# Patient Record
Sex: Female | Born: 1950
Health system: Southern US, Community
[De-identification: ages and names within clinical notes are randomized; demographics above are authoritative.]

## PROBLEM LIST (undated history)

## (undated) DIAGNOSIS — Z923 Personal history of irradiation: Secondary | ICD-10-CM

## (undated) DIAGNOSIS — Z9221 Personal history of antineoplastic chemotherapy: Secondary | ICD-10-CM

## (undated) DIAGNOSIS — E041 Nontoxic single thyroid nodule: Secondary | ICD-10-CM

## (undated) DIAGNOSIS — C50919 Malignant neoplasm of unspecified site of unspecified female breast: Secondary | ICD-10-CM

## (undated) HISTORY — DX: Nontoxic single thyroid nodule: E04.1

## (undated) HISTORY — PX: BREAST BIOPSY: SHX20

## (undated) HISTORY — DX: Malignant neoplasm of unspecified site of unspecified female breast: C50.919

---
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
## 1974-10-27 HISTORY — PX: TUBAL LIGATION: SHX77

## 1976-10-27 HISTORY — PX: BARTHOLIN GLAND CYST EXCISION: SHX565

## 2006-10-27 DIAGNOSIS — Z923 Personal history of irradiation: Secondary | ICD-10-CM

## 2006-10-27 DIAGNOSIS — C50919 Malignant neoplasm of unspecified site of unspecified female breast: Secondary | ICD-10-CM

## 2006-10-27 HISTORY — DX: Malignant neoplasm of unspecified site of unspecified female breast: C50.919

## 2006-10-27 HISTORY — DX: Personal history of irradiation: Z92.3

## 2007-03-19 HISTORY — PX: BREAST BIOPSY: SHX20

## 2007-03-28 DIAGNOSIS — Z9221 Personal history of antineoplastic chemotherapy: Secondary | ICD-10-CM

## 2007-03-28 HISTORY — DX: Personal history of antineoplastic chemotherapy: Z92.21

## 2007-08-11 HISTORY — PX: BREAST LUMPECTOMY: SHX2

## 2013-08-10 HISTORY — PX: BREAST BIOPSY: SHX20

## 2013-08-25 LAB — HM MAMMOGRAPHY

## 2015-10-28 DIAGNOSIS — E041 Nontoxic single thyroid nodule: Secondary | ICD-10-CM

## 2015-10-28 HISTORY — DX: Nontoxic single thyroid nodule: E04.1

## 2015-10-28 HISTORY — PX: BREAST EXCISIONAL BIOPSY: SUR124

## 2016-04-12 LAB — VITAMIN D 25 HYDROXY (VIT D DEFICIENCY, FRACTURES): Vit D, 25-Hydroxy: 26.3

## 2016-04-12 LAB — HEPATIC FUNCTION PANEL
ALT: 20 (ref 7–35)
AST: 19 (ref 13–35)
Alkaline Phosphatase: 60 (ref 25–125)
BILIRUBIN, TOTAL: 0.4

## 2016-04-12 LAB — LIPID PANEL
Cholesterol: 261 — AB (ref 0–200)
HDL: 62 (ref 35–70)
LDL Cholesterol: 179
Triglycerides: 98 (ref 40–160)

## 2016-04-12 LAB — BASIC METABOLIC PANEL
BUN: 15 (ref 4–21)
Creatinine: 0.7 (ref 0.5–1.1)
GLUCOSE: 96
POTASSIUM: 5.1 (ref 3.4–5.3)
SODIUM: 144 (ref 137–147)

## 2016-04-12 LAB — CBC AND DIFFERENTIAL
HEMATOCRIT: 46 (ref 36–46)
HEMOGLOBIN: 14.9 (ref 12.0–16.0)
Neutrophils Absolute: 3
PLATELETS: 154 (ref 150–399)
WBC: 5.4

## 2016-07-03 LAB — HM COLONOSCOPY

## 2017-07-23 ENCOUNTER — Ambulatory Visit (INDEPENDENT_AMBULATORY_CARE_PROVIDER_SITE_OTHER): Payer: Medicare Other | Admitting: Family Medicine

## 2017-07-23 ENCOUNTER — Encounter: Payer: Self-pay | Admitting: Family Medicine

## 2017-07-23 VITALS — BP 112/66 | HR 72 | Temp 98.1°F | Resp 16 | Ht 60.0 in | Wt 163.0 lb

## 2017-07-23 DIAGNOSIS — E7800 Pure hypercholesterolemia, unspecified: Secondary | ICD-10-CM

## 2017-07-23 DIAGNOSIS — E785 Hyperlipidemia, unspecified: Secondary | ICD-10-CM | POA: Insufficient documentation

## 2017-07-23 DIAGNOSIS — Z0000 Encounter for general adult medical examination without abnormal findings: Secondary | ICD-10-CM

## 2017-07-23 DIAGNOSIS — E669 Obesity, unspecified: Secondary | ICD-10-CM

## 2017-07-23 DIAGNOSIS — Z853 Personal history of malignant neoplasm of breast: Secondary | ICD-10-CM | POA: Diagnosis not present

## 2017-07-23 DIAGNOSIS — Z780 Asymptomatic menopausal state: Secondary | ICD-10-CM

## 2017-07-23 DIAGNOSIS — Z23 Encounter for immunization: Secondary | ICD-10-CM

## 2017-07-23 DIAGNOSIS — Z1231 Encounter for screening mammogram for malignant neoplasm of breast: Secondary | ICD-10-CM

## 2017-07-23 DIAGNOSIS — E041 Nontoxic single thyroid nodule: Secondary | ICD-10-CM | POA: Insufficient documentation

## 2017-07-23 DIAGNOSIS — Z6831 Body mass index (BMI) 31.0-31.9, adult: Secondary | ICD-10-CM

## 2017-07-23 LAB — CBC WITH DIFFERENTIAL/PLATELET
Basophils Absolute: 42 cells/uL (ref 0–200)
Basophils Relative: 0.7 %
EOS PCT: 1.5 %
Eosinophils Absolute: 90 cells/uL (ref 15–500)
HEMATOCRIT: 42.3 % (ref 35.0–45.0)
HEMOGLOBIN: 14.2 g/dL (ref 11.7–15.5)
LYMPHS ABS: 2286 {cells}/uL (ref 850–3900)
MCH: 29.8 pg (ref 27.0–33.0)
MCHC: 33.6 g/dL (ref 32.0–36.0)
MCV: 88.7 fL (ref 80.0–100.0)
MPV: 12.8 fL — AB (ref 7.5–12.5)
Monocytes Relative: 8.6 %
NEUTROS ABS: 3066 {cells}/uL (ref 1500–7800)
Neutrophils Relative %: 51.1 %
Platelets: 146 10*3/uL (ref 140–400)
RBC: 4.77 10*6/uL (ref 3.80–5.10)
RDW: 12.7 % (ref 11.0–15.0)
Total Lymphocyte: 38.1 %
WBC mixed population: 516 cells/uL (ref 200–950)
WBC: 6 10*3/uL (ref 3.8–10.8)

## 2017-07-23 LAB — LIPID PANEL
Cholesterol: 287 mg/dL — ABNORMAL HIGH (ref <200)
HDL: 61 mg/dL (ref >50)
LDL CHOLESTEROL (CALC): 192 mg/dL — AB
NON-HDL CHOLESTEROL (CALC): 226 mg/dL — AB (ref <130)
Total CHOL/HDL Ratio: 4.7 (calc) (ref <5.0)
Triglycerides: 168 mg/dL — ABNORMAL HIGH (ref <150)

## 2017-07-23 LAB — COMPREHENSIVE METABOLIC PANEL
AG RATIO: 1.6 (calc) (ref 1.0–2.5)
ALT: 25 U/L (ref 6–29)
AST: 24 U/L (ref 10–35)
Albumin: 4.2 g/dL (ref 3.6–5.1)
Alkaline phosphatase (APISO): 62 U/L (ref 33–130)
BILIRUBIN TOTAL: 0.3 mg/dL (ref 0.2–1.2)
BUN: 20 mg/dL (ref 7–25)
CALCIUM: 9.2 mg/dL (ref 8.6–10.4)
CO2: 26 mmol/L (ref 20–32)
Chloride: 103 mmol/L (ref 98–110)
Creat: 0.69 mg/dL (ref 0.50–0.99)
GLUCOSE: 103 mg/dL — AB (ref 65–99)
Globulin: 2.7 g/dL (calc) (ref 1.9–3.7)
POTASSIUM: 3.9 mmol/L (ref 3.5–5.3)
Sodium: 139 mmol/L (ref 135–146)
TOTAL PROTEIN: 6.9 g/dL (ref 6.1–8.1)

## 2017-07-23 LAB — TSH: TSH: 0.6 mIU/L (ref 0.40–4.50)

## 2017-07-23 NOTE — Patient Instructions (Signed)
Preventive Care 65 Years and Older, Female Preventive care refers to lifestyle choices and visits with your health care provider that can promote health and wellness. What does preventive care include?  A yearly physical exam. This is also called an annual well check.  Dental exams once or twice a year.  Routine eye exams. Ask your health care provider how often you should have your eyes checked.  Personal lifestyle choices, including: ? Daily care of your teeth and gums. ? Regular physical activity. ? Eating a healthy diet. ? Avoiding tobacco and drug use. ? Limiting alcohol use. ? Practicing safe sex. ? Taking low-dose aspirin every day. ? Taking vitamin and mineral supplements as recommended by your health care provider. What happens during an annual well check? The services and screenings done by your health care provider during your annual well check will depend on your age, overall health, lifestyle risk factors, and family history of disease. Counseling Your health care provider may ask you questions about your:  Alcohol use.  Tobacco use.  Drug use.  Emotional well-being.  Home and relationship well-being.  Sexual activity.  Eating habits.  History of falls.  Memory and ability to understand (cognition).  Work and work environment.  Reproductive health.  Screening You may have the following tests or measurements:  Height, weight, and BMI.  Blood pressure.  Lipid and cholesterol levels. These may be checked every 5 years, or more frequently if you are over 50 years old.  Skin check.  Lung cancer screening. You may have this screening every year starting at age 55 if you have a 30-pack-year history of smoking and currently smoke or have quit within the past 15 years.  Fecal occult blood test (FOBT) of the stool. You may have this test every year starting at age 50.  Flexible sigmoidoscopy or colonoscopy. You may have a sigmoidoscopy every 5 years or  a colonoscopy every 10 years starting at age 50.  Hepatitis C blood test.  Hepatitis B blood test.  Sexually transmitted disease (STD) testing.  Diabetes screening. This is done by checking your blood sugar (glucose) after you have not eaten for a while (fasting). You may have this done every 1-3 years.  Bone density scan. This is done to screen for osteoporosis. You may have this done starting at age 65.  Mammogram. This may be done every 1-2 years. Talk to your health care provider about how often you should have regular mammograms.  Talk with your health care provider about your test results, treatment options, and if necessary, the need for more tests. Vaccines Your health care provider may recommend certain vaccines, such as:  Influenza vaccine. This is recommended every year.  Tetanus, diphtheria, and acellular pertussis (Tdap, Td) vaccine. You may need a Td booster every 10 years.  Varicella vaccine. You may need this if you have not been vaccinated.  Zoster vaccine. You may need this after age 60.  Measles, mumps, and rubella (MMR) vaccine. You may need at least one dose of MMR if you were born in 1957 or later. You may also need a second dose.  Pneumococcal 13-valent conjugate (PCV13) vaccine. One dose is recommended after age 65.  Pneumococcal polysaccharide (PPSV23) vaccine. One dose is recommended after age 65.  Meningococcal vaccine. You may need this if you have certain conditions.  Hepatitis A vaccine. You may need this if you have certain conditions or if you travel or work in places where you may be exposed to hepatitis   A.  Hepatitis B vaccine. You may need this if you have certain conditions or if you travel or work in places where you may be exposed to hepatitis B.  Haemophilus influenzae type b (Hib) vaccine. You may need this if you have certain conditions.  Talk to your health care provider about which screenings and vaccines you need and how often you  need them. This information is not intended to replace advice given to you by your health care provider. Make sure you discuss any questions you have with your health care provider. Document Released: 11/09/2015 Document Revised: 07/02/2016 Document Reviewed: 08/14/2015 Elsevier Interactive Patient Education  2017 [employer].

## 2017-07-23 NOTE — Progress Notes (Signed)
Patient: April Stuart, Female    DOB: 23-Jul-1951, 66 y.o.   MRN: [id number] Visit Date: 07/24/2017  Today's Provider: Lavon Paganini, MD   Chief Complaint  Patient presents with  . Establish Care  . Medicare Wellness   Subjective:    Annual wellness visit April Stuart is a 66 y.o. female. She feels well. She reports exercising daily; walks for 30-45 minutes with a neighbor. She reports she is sleeping fairly well. Pt reports she has had thyroid problems in the past. She states she believes she had "polyps" on her thyroid, and needs [place] every 6 months. She has a H/O breast cancer, and had a lumpectomy in 2008.  Now just has annual mammograms.  Last year had L axillary node biopsied and it was benign.  She has had high cholesterol that she is controlling with diet and exercise.  She does not take a statin.  She has not tolerated Lipitor or Crestor due to myalgias and is hesitant to try another statin. -----------------------------------------------------------   Review of Systems  Constitutional: Negative.   HENT: Positive for nosebleeds (improving). Negative for congestion, dental problem, drooling, ear discharge, ear pain, facial swelling, hearing loss, mouth sores, postnasal drip, rhinorrhea, sinus pain, sinus pressure, sneezing, sore throat, tinnitus, trouble swallowing and voice change.   Eyes: Negative.   Respiratory: Negative.   Cardiovascular: Negative.   Gastrointestinal: Negative.   Endocrine: Negative.   Genitourinary: Negative.   Musculoskeletal: Negative.   Skin: Negative.   Allergic/Immunologic: Negative.   Neurological: Negative.   Hematological: Negative.   Psychiatric/Behavioral: Negative.     Social History   Social History  . Marital status: Divorced    Spouse name: N/A  . Number of children: 3  . Years of education: N/A   Occupational History  . retired     Armed forces technical officer with [employer]   Social History Main Topics  .  Smoking status: Former Smoker    Packs/day: 2.00    Years: 38.00    Types: Cigarettes    Quit date: 10/26/1997  . Smokeless tobacco: Never Used  . Alcohol use 3.0 - 4.2 oz/week    5 - 7 Glasses of wine per week  . Drug use: No  . Sexual activity: No   Other Topics Concern  . Not on file   Social History Narrative  . No narrative on file    Past Medical History:  Diagnosis Date  . Breast cancer ([place]) 2008   R breast  . Thyroid nodule 2017     Patient Active Problem List   Diagnosis Date Noted  . Obesity 07/24/2017  . History of breast cancer 07/23/2017  . Hyperlipidemia 07/23/2017  . Thyroid nodule 07/23/2017    Past Surgical History:  Procedure Laterality Date  . APPENDECTOMY  1958  . BARTHOLIN GLAND CYST EXCISION  1978  . BREAST LUMPECTOMY Right 2008  . [place]   x [place]  . TUBAL LIGATION  1976    Her family history includes Alcohol abuse in her mother; Dementia in her mother; HIV in her brother; Pancreatic cancer in her father; Prostate cancer in her father; Stroke (age of onset: 58) in her mother.      Current Outpatient Prescriptions:  Marland Kitchen  Multiple Vitamins-Minerals (CENTRUM ADULTS PO), Take by mouth., Disp: , Rfl:   Patient Care Team: Virginia Crews, MD as PCP - General (Family Medicine)  Objective:   Vitals: BP 112/66 (BP Location: Left Arm, Patient Position: Sitting, Cuff Size: Normal)   Pulse 72   Temp 98.1 F (36.7 C) (Oral)   Resp 16   Ht 5' (1.524 m)   Wt 163 lb (73.9 kg)   BMI 31.83 kg/m   Physical Exam  Constitutional: She is oriented to person, place, and time. She appears well-developed and well-nourished. No distress.  HENT:  Head: Normocephalic and atraumatic.  Right Ear: External ear normal.  Left Ear: External ear normal.  Nose: Nose normal.  Mouth/Throat: Oropharynx is clear and moist.  Eyes: Pupils are equal, round, and reactive to light. Conjunctivae are normal. No  scleral icterus.  Neck: Neck supple. No thyromegaly present.  Cardiovascular: Normal rate, regular rhythm, normal heart sounds and intact distal pulses.   No murmur heard. Pulmonary/Chest: Effort normal and breath sounds normal. No respiratory distress. She has no wheezes. She has no rales.  Breasts: right breast normal without skin or nipple changes or axillary nodes, right breast with 2 lumpectomy scars with surrounding scar tissue palpable. left breast normal without mass, skin or nipple changes or axillary nodes.  Abdominal: Soft. Bowel sounds are normal. She exhibits no distension. There is no tenderness. There is no rebound and no guarding.  Musculoskeletal: She exhibits no edema or deformity.  Lymphadenopathy:    She has no cervical adenopathy.  Neurological: She is alert and oriented to person, place, and time.  Skin: Skin is warm and dry. No rash noted.  Psychiatric: She has a normal mood and affect. Her behavior is normal.  Vitals reviewed.   Activities of Daily Living In your present state of health, do you have any difficulty performing the following activities: 07/23/2017  Hearing? N  Vision? N  Difficulty concentrating or making decisions? N  Walking or climbing stairs? N  Dressing or bathing? N  Doing errands, shopping? N    Fall Risk Assessment Fall Risk  07/23/2017  Falls in the past year? No     Depression Screen PHQ 2/9 Scores 07/23/2017  PHQ - 2 Score 0    Cognitive Testing - 6-CIT  Correct? Score   What year is it? yes 0 0 or 4  What month is it? yes 0 0 or 3  Memorize:    Pia Mau,  41,  [address]  [place],      What time is it? (within 1 hour) yes 0 0 or 3  Count backwards from 20 yes 2 0, 2, or 4  Name the months of the year yes 0 0, 2, or 4  Repeat name & address above no 4 0, 2, 4, 6, 8, or 10       TOTAL SCORE  6/28   Interpretation:  Normal  Normal (0-7) Abnormal (8-28)     Assessment & Plan:     Annual Wellness Visit  Reviewed  patient's Family Medical History Reviewed and updated list of patient's medical providers Assessment of cognitive impairment was done Assessed patient's functional ability Established a written schedule for health screening [place] Completed and Reviewed  Exercise Activities and Dietary recommendations Goals    None      Immunization History  Administered Date(s) Administered  . Influenza, High Dose Seasonal PF 07/23/2017    Health Maintenance  Topic Date Due  . Hepatitis C Screening  Mar 04, 1951  . TETANUS/TDAP  03/07/1969  . MAMMOGRAM  03/07/2000  . COLONOSCOPY  03/07/2000  . DEXA SCAN  03/08/2015  . PNA vac Low Risk Adult (1 of 2 - PCV13) 03/08/2015  . INFLUENZA VACCINE  Completed     Discussed health benefits of physical activity, and encouraged her to engage in regular exercise appropriate for her age and condition.   Problem List Items Addressed This Visit      Endocrine   Thyroid nodule    Check TSH and repeat Thyroid US Will request records from previous PCP regarding previous scans      Relevant Orders   TSH (Completed)   US THYROID     Other   History of breast cancer    Continue screening mammograms Latest mammogram scanned into chart for comparison      Relevant Orders   MM Digital Screening   Hyperlipidemia    Repeat lipid panel Consider PCSK9 if high ASCVD risk      Relevant Orders   Comprehensive metabolic panel (Completed)   Lipid panel (Completed)   Obesity    Discussed healthy diet and exercise       Other Visit Diagnoses    Medicare annual wellness visit, subsequent    -  Primary   Healthcare maintenance       Relevant Orders   CBC w/Diff/Platelet (Completed)   Comprehensive metabolic panel (Completed)   Lipid panel (Completed)   TSH (Completed)   MM Digital Screening   Encounter for screening mammogram for breast cancer       Relevant Orders   MM Digital Screening   Postmenopausal estrogen  deficiency       Relevant Orders   DG Bone Density   Encounter for immunization       Relevant Orders   Flu vaccine HIGH DOSE PF (Completed)        ------------------------------------------------------------------------------------------------------------ The entirety of the information documented in the History of Present Illness, Review of Systems and Physical Exam were personally obtained by me. Portions of this information were initially documented by Raquel Sarna Ratchford, CMA and reviewed by me for thoroughness and accuracy.     Lavon Paganini, MD  [facility]

## 2017-07-24 ENCOUNTER — Telehealth: Payer: Self-pay

## 2017-07-24 ENCOUNTER — Other Ambulatory Visit: Payer: Self-pay | Admitting: *Deleted

## 2017-07-24 ENCOUNTER — Inpatient Hospital Stay
Admission: RE | Admit: 2017-07-24 | Discharge: 2017-07-24 | Disposition: A | Discharge status: Home / Self Care | Payer: Self-pay | Source: Ambulatory Visit | Attending: *Deleted | Admitting: *Deleted

## 2017-07-24 DIAGNOSIS — Z9289 Personal history of other medical treatment: Secondary | ICD-10-CM

## 2017-07-24 DIAGNOSIS — E669 Obesity, unspecified: Secondary | ICD-10-CM | POA: Insufficient documentation

## 2017-07-24 NOTE — Assessment & Plan Note (Signed)
Continue screening mammograms Latest mammogram scanned into chart for comparison

## 2017-07-24 NOTE — Assessment & Plan Note (Signed)
Check TSH and repeat Thyroid US Will request records from previous PCP regarding previous scans

## 2017-07-24 NOTE — Assessment & Plan Note (Signed)
Discussed healthy diet and exercise

## 2017-07-24 NOTE — Telephone Encounter (Signed)
Patient has been advised. KW 

## 2017-07-24 NOTE — Telephone Encounter (Signed)
-----   Message from Virginia Crews, MD sent at 07/24/2017 10:09 AM EDT ----- Normal Blood counts, kidney function, liver function, electrolytes, Thyroid function.  Glucose is slightly elevated if she was fasting at the time (doubtful given it was late afternoon).  Cholesterol is high.  10 year risk of heart disease/stroke is still low at 6%. Over 7.5%, I would suggest cholesterol-lowering medication.  I would start a daily baby aspirin for heart attack and stroke prevention.  Virginia Crews, MD, MPH [facility] 07/24/2017 10:09 AM

## 2017-07-24 NOTE — Telephone Encounter (Signed)
Unsure where to fax pt's release of information form. Had primary in [place], [place]. Pt states she will call back with information.

## 2017-07-24 NOTE — Telephone Encounter (Signed)
Pt returned call and provided information. ROI faxed.

## 2017-07-24 NOTE — Assessment & Plan Note (Signed)
Repeat lipid panel Consider PCSK9 if high ASCVD risk

## 2017-07-27 ENCOUNTER — Encounter: Payer: Self-pay | Admitting: Family Medicine

## 2017-07-29 ENCOUNTER — Ambulatory Visit
Admission: RE | Admit: 2017-07-29 | Discharge: 2017-07-29 | Disposition: A | Discharge status: Home / Self Care | Payer: Medicare Other | Source: Ambulatory Visit | Attending: Family Medicine | Admitting: Family Medicine

## 2017-07-29 DIAGNOSIS — E041 Nontoxic single thyroid nodule: Secondary | ICD-10-CM | POA: Insufficient documentation

## 2017-07-30 ENCOUNTER — Other Ambulatory Visit: Payer: Self-pay | Admitting: Family Medicine

## 2017-07-30 ENCOUNTER — Telehealth: Payer: Self-pay

## 2017-07-30 DIAGNOSIS — E041 Nontoxic single thyroid nodule: Secondary | ICD-10-CM

## 2017-07-30 NOTE — Telephone Encounter (Signed)
Pt advised and agrees with treatment plan. 

## 2017-07-30 NOTE — Telephone Encounter (Signed)
-----   Message from Virginia Crews, MD sent at 07/30/2017  8:26 AM EDT ----- 4 different thyroid nodules noted.  Likely needs biopsy to evaluate further.  Referral to ENT placed.  Virginia Crews, MD, MPH [facility] 07/30/2017 8:26 AM

## 2017-07-31 ENCOUNTER — Other Ambulatory Visit: Payer: Self-pay | Admitting: *Deleted

## 2017-07-31 ENCOUNTER — Inpatient Hospital Stay
Admission: RE | Admit: 2017-07-31 | Discharge: 2017-07-31 | Disposition: A | Discharge status: Home / Self Care | Payer: Self-pay | Source: Ambulatory Visit | Attending: *Deleted | Admitting: *Deleted

## 2017-07-31 DIAGNOSIS — Z9289 Personal history of other medical treatment: Secondary | ICD-10-CM

## 2017-08-12 ENCOUNTER — Telehealth: Payer: Self-pay | Admitting: Family Medicine

## 2017-08-12 NOTE — Telephone Encounter (Signed)
ROI ([facility]) faxed to [facility]  ( Dr. Silver Huguenin).

## 2017-08-18 DIAGNOSIS — E042 Nontoxic multinodular goiter: Secondary | ICD-10-CM | POA: Diagnosis not present

## 2017-08-19 ENCOUNTER — Other Ambulatory Visit: Payer: Self-pay | Admitting: Unknown Physician Specialty

## 2017-08-19 ENCOUNTER — Ambulatory Visit
Admission: RE | Admit: 2017-08-19 | Discharge: 2017-08-19 | Disposition: A | Discharge status: Home / Self Care | Payer: Medicare Other | Source: Ambulatory Visit | Attending: Family Medicine | Admitting: Family Medicine

## 2017-08-19 ENCOUNTER — Telehealth: Payer: Self-pay

## 2017-08-19 DIAGNOSIS — Z1231 Encounter for screening mammogram for malignant neoplasm of breast: Secondary | ICD-10-CM | POA: Diagnosis not present

## 2017-08-19 DIAGNOSIS — Z13820 Encounter for screening for osteoporosis: Secondary | ICD-10-CM | POA: Diagnosis not present

## 2017-08-19 DIAGNOSIS — Z853 Personal history of malignant neoplasm of breast: Secondary | ICD-10-CM

## 2017-08-19 DIAGNOSIS — Z0000 Encounter for general adult medical examination without abnormal findings: Secondary | ICD-10-CM

## 2017-08-19 DIAGNOSIS — E041 Nontoxic single thyroid nodule: Secondary | ICD-10-CM

## 2017-08-19 DIAGNOSIS — Z780 Asymptomatic menopausal state: Secondary | ICD-10-CM | POA: Diagnosis not present

## 2017-08-19 HISTORY — DX: Personal history of antineoplastic chemotherapy: Z92.21

## 2017-08-19 HISTORY — DX: Personal history of irradiation: Z92.3

## 2017-08-19 NOTE — Telephone Encounter (Signed)
Left message advising pt. OK per DPR. 

## 2017-08-19 NOTE — Telephone Encounter (Signed)
-----   Message from Virginia Crews, MD sent at 08/19/2017  3:37 PM EDT ----- Normal bone density.  No osteopenia or osteoporosis  Virginia Crews, MD, MPH [facility] 08/19/2017 3:37 PM

## 2017-08-20 ENCOUNTER — Telehealth: Payer: Self-pay

## 2017-08-20 NOTE — Telephone Encounter (Signed)
Pt advised.

## 2017-08-20 NOTE — Telephone Encounter (Signed)
-----   Message from Virginia Crews, MD sent at 08/20/2017  9:17 AM EDT ----- Normal mammogram  Bacigalupo, Dionne Bucy, MD, MPH [facility] 08/20/2017 9:17 AM

## 2017-08-31 ENCOUNTER — Ambulatory Visit
Admission: RE | Admit: 2017-08-31 | Discharge: 2017-08-31 | Disposition: A | Discharge status: Home / Self Care | Payer: Medicare Other | Source: Ambulatory Visit | Attending: Unknown Physician Specialty | Admitting: Unknown Physician Specialty

## 2017-08-31 DIAGNOSIS — Z880 Allergy status to penicillin: Secondary | ICD-10-CM | POA: Insufficient documentation

## 2017-08-31 DIAGNOSIS — Z882 Allergy status to sulfonamides status: Secondary | ICD-10-CM | POA: Diagnosis not present

## 2017-08-31 DIAGNOSIS — Z87891 Personal history of nicotine dependence: Secondary | ICD-10-CM | POA: Insufficient documentation

## 2017-08-31 DIAGNOSIS — Z853 Personal history of malignant neoplasm of breast: Secondary | ICD-10-CM | POA: Insufficient documentation

## 2017-08-31 DIAGNOSIS — E042 Nontoxic multinodular goiter: Secondary | ICD-10-CM | POA: Diagnosis not present

## 2017-08-31 DIAGNOSIS — E041 Nontoxic single thyroid nodule: Secondary | ICD-10-CM

## 2017-08-31 NOTE — Procedures (Signed)
US thyroid FNA times three  Complications:  None  Blood Loss: none  See dictation in canopy pacs

## 2017-09-01 ENCOUNTER — Other Ambulatory Visit: Payer: Self-pay | Admitting: Unknown Physician Specialty

## 2017-09-01 DIAGNOSIS — E041 Nontoxic single thyroid nodule: Secondary | ICD-10-CM

## 2017-09-03 LAB — CYTOLOGY - NON PAP

## 2017-09-04 ENCOUNTER — Other Ambulatory Visit: Payer: Self-pay | Admitting: Unknown Physician Specialty

## 2017-09-04 DIAGNOSIS — E041 Nontoxic single thyroid nodule: Secondary | ICD-10-CM

## 2017-09-07 DIAGNOSIS — E042 Nontoxic multinodular goiter: Secondary | ICD-10-CM | POA: Diagnosis not present

## 2018-03-01 ENCOUNTER — Ambulatory Visit
Admission: RE | Admit: 2018-03-01 | Discharge: 2018-03-01 | Disposition: A | Discharge status: Home / Self Care | Payer: Medicare Other | Source: Ambulatory Visit | Attending: Unknown Physician Specialty | Admitting: Unknown Physician Specialty

## 2018-03-01 DIAGNOSIS — E042 Nontoxic multinodular goiter: Secondary | ICD-10-CM | POA: Insufficient documentation

## 2018-03-01 DIAGNOSIS — E041 Nontoxic single thyroid nodule: Secondary | ICD-10-CM

## 2018-03-02 ENCOUNTER — Other Ambulatory Visit: Payer: Self-pay | Admitting: Unknown Physician Specialty

## 2018-03-02 DIAGNOSIS — E041 Nontoxic single thyroid nodule: Secondary | ICD-10-CM

## 2018-03-17 ENCOUNTER — Encounter: Payer: Self-pay | Admitting: Family Medicine

## 2018-03-17 ENCOUNTER — Ambulatory Visit (INDEPENDENT_AMBULATORY_CARE_PROVIDER_SITE_OTHER): Payer: Medicare Other | Admitting: Family Medicine

## 2018-03-17 VITALS — BP 120/68 | HR 73 | Temp 98.3°F | Resp 16 | Wt 165.0 lb

## 2018-03-17 DIAGNOSIS — E669 Obesity, unspecified: Secondary | ICD-10-CM | POA: Diagnosis not present

## 2018-03-17 DIAGNOSIS — Z6832 Body mass index (BMI) 32.0-32.9, adult: Secondary | ICD-10-CM | POA: Diagnosis not present

## 2018-03-17 DIAGNOSIS — Z853 Personal history of malignant neoplasm of breast: Secondary | ICD-10-CM | POA: Diagnosis not present

## 2018-03-17 DIAGNOSIS — L658 Other specified nonscarring hair loss: Secondary | ICD-10-CM | POA: Diagnosis not present

## 2018-03-17 NOTE — Assessment & Plan Note (Signed)
Hair loss pattern is consistent with female pattern hair loss Will check CMP, CBC, TSH to ensure no underlying medical etiology Does not seem to be stress related Can try Rogaine if she desires

## 2018-03-17 NOTE — Progress Notes (Signed)
Patient: April Stuart Female    DOB: [birth year]   67 y.o.   MRN: [id number] Visit Date: 03/17/2018  Today's Provider: Lavon Paganini, MD   I, Martha Clan, CMA, am acting as scribe for Lavon Paganini, MD.  Chief Complaint  Patient presents with  . Alopecia   Subjective:    HPI   Pt is c/o hair loss. She states this has been present for several months, but is worsening. She has tried Biotin, without relief of hair loss. She states this is reminiscent of losing her hair during chemotherapy. It comes out in clumps. She also is c/o fatigue, weight gain, and night sweats. She denies heat/cold intolerance, bowel changes, palpitation.  Hair loss is generalized and more concentrated in crown and part line.  Wt Readings from Last 3 Encounters:  03/17/18 165 lb (74.8 kg)  08/31/17 162 lb (73.5 kg)  07/23/17 163 lb (73.9 kg)    Pt is concerned about weight gain, and would like a referral to a nutritionist.   She also states that she is getting postmastectomy prosthesis from a medical supply company near [place].  She states they will be sending a fax to request Rx for supplies she has gotten.  Allergies  Allergen Reactions  . Penicillins Shortness Of Breath and Swelling    Facial swelling  . Sulfa Antibiotics Swelling    Facial swelling  . Crestor [Rosuvastatin Calcium]     Myalgias   . Lipitor [Atorvastatin]     myalgias     Current Outpatient Medications:  .  Biotin 1000 MCG CHEW, Chew by mouth., Disp: , Rfl:  .  cholecalciferol (VITAMIN D) 1000 units tablet, Take 1,000 Units daily by mouth., Disp: , Rfl:  .  Multiple Vitamins-Minerals (CENTRUM ADULTS PO), Take by mouth., Disp: , Rfl:   Review of Systems  Constitutional: Positive for diaphoresis (night sweats), fatigue and unexpected weight change. Negative for activity change, appetite change, chills and fever.  Respiratory: Negative for shortness of breath.   Cardiovascular: Negative for chest pain,  palpitations and leg swelling.  Gastrointestinal: Negative for constipation and diarrhea.  Endocrine: Negative for cold intolerance and heat intolerance.    Social History   Tobacco Use  . Smoking status: Former Smoker    Packs/day: 2.00    Years: 38.00    Pack years: 76.00    Types: Cigarettes    Last attempt to quit: 10/26/1997    Years since quitting: 20.4  . Smokeless tobacco: Never Used  Substance Use Topics  . Alcohol use: Yes    Alcohol/week: 3.0 - 4.2 oz    Types: 5 - 7 Glasses of wine per week   Objective:   BP 120/68 (BP Location: Left Arm, Patient Position: Sitting, Cuff Size: Large)   Pulse 73   Temp 98.3 F (36.8 C) (Oral)   Resp 16   Wt 165 lb (74.8 kg)   BMI 32.22 kg/m    Physical Exam  Constitutional: She is oriented to person, place, and time. She appears well-developed and well-nourished. No distress.  HENT:  Head: Normocephalic and atraumatic.  Eyes: Conjunctivae are normal. No scleral icterus.  Neck: Neck supple. Thyromegaly present.  Cardiovascular: Normal rate, regular rhythm, normal heart sounds and intact distal pulses.  No murmur heard. Pulmonary/Chest: Effort normal and breath sounds normal. No respiratory distress. She has no wheezes. She has no rales.  Musculoskeletal: She exhibits no edema.  Lymphadenopathy:    She has no cervical adenopathy.  Neurological: She is alert and oriented to person, place, and time.  Skin: Skin is warm. Capillary refill takes less than 2 seconds. No rash noted.  Thinning of hair along part and crown.  No discrete missing patches.   Psychiatric: She has a normal mood and affect. Her behavior is normal.  Vitals reviewed.      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    Hair loss pattern is consistent with female pattern hair loss Will check CMP, CBC, TSH to ensure no underlying medical etiology Does not seem to  be stress related Can try Rogaine if she desires       Relevant Orders   TSH   Comprehensive metabolic panel   CBC     Other   History of breast cancer    Will complete form for breast prosthesis when fax comes in      Obesity    Discussed diet and exercise Advised patient that Medicare does not often cover nutritionist services Advised patient to check with her particular insurance plan to see if nutritionist is covered before referral was placed          Return if symptoms worsen or fail to improve.   The entirety of the information documented in the History of Present Illness, Review of Systems and Physical Exam were personally obtained by me. Portions of this information were initially documented by Tiburcio Pea, CMA and reviewed by me for thoroughness and accuracy.    Virginia Crews, MD, MPH [facility] 03/17/2018 11:52 AM

## 2018-03-17 NOTE — Assessment & Plan Note (Signed)
Discussed diet and exercise Advised patient that Medicare does not often cover nutritionist services Advised patient to check with her particular insurance plan to see if nutritionist is covered before referral was placed

## 2018-03-17 NOTE — Patient Instructions (Signed)

## 2018-03-17 NOTE — Assessment & Plan Note (Signed)
Will complete form for breast prosthesis when fax comes in

## 2018-03-18 ENCOUNTER — Telehealth: Payer: Self-pay

## 2018-03-18 LAB — CBC
Hematocrit: 42 % (ref 34.0–46.6)
Hemoglobin: 14.2 g/dL (ref 11.1–15.9)
MCH: 30 pg (ref 26.6–33.0)
MCHC: 33.8 g/dL (ref 31.5–35.7)
MCV: 89 fL (ref 79–97)
PLATELETS: 179 10*3/uL (ref 150–450)
RBC: 4.73 x10E6/uL (ref 3.77–5.28)
RDW: 14.1 % (ref 12.3–15.4)
WBC: 5.1 10*3/uL (ref 3.4–10.8)

## 2018-03-18 LAB — COMPREHENSIVE METABOLIC PANEL
ALBUMIN: 4.5 g/dL (ref 3.6–4.8)
ALK PHOS: 70 IU/L (ref 39–117)
ALT: 36 IU/L — ABNORMAL HIGH (ref 0–32)
AST: 25 IU/L (ref 0–40)
Albumin/Globulin Ratio: 1.9 (ref 1.2–2.2)
BUN/Creatinine Ratio: 27 (ref 12–28)
BUN: 19 mg/dL (ref 8–27)
Bilirubin Total: 0.3 mg/dL (ref 0.0–1.2)
CO2: 22 mmol/L (ref 20–29)
CREATININE: 0.71 mg/dL (ref 0.57–1.00)
Calcium: 9.5 mg/dL (ref 8.7–10.3)
Chloride: 103 mmol/L (ref 96–106)
GFR calc non Af Amer: 88 mL/min/{1.73_m2} (ref >59)
GFR, EST AFRICAN AMERICAN: 101 mL/min/{1.73_m2} (ref >59)
GLOBULIN, TOTAL: 2.4 g/dL (ref 1.5–4.5)
Glucose: 115 mg/dL — ABNORMAL HIGH (ref 65–99)
Potassium: 4.1 mmol/L (ref 3.5–5.2)
SODIUM: 141 mmol/L (ref 134–144)
TOTAL PROTEIN: 6.9 g/dL (ref 6.0–8.5)

## 2018-03-18 LAB — TSH: TSH: 0.596 u[IU]/mL (ref 0.450–4.500)

## 2018-03-18 NOTE — Telephone Encounter (Signed)
Pt advised. States she was not fasting.

## 2018-03-18 NOTE — Telephone Encounter (Signed)
-----   Message from Virginia Crews, MD sent at 03/18/2018  9:10 AM EDT ----- Normal thyroid function, kidney function, liver function, electrolytes, blood counts.  Blood sugar is slightly elevated, but if not fasting this is normal.  Bacigalupo, Dionne Bucy, MD, MPH [facility] 03/18/2018 9:10 AM

## 2018-05-20 ENCOUNTER — Encounter: Payer: Self-pay | Admitting: Family Medicine

## 2018-05-20 ENCOUNTER — Ambulatory Visit (INDEPENDENT_AMBULATORY_CARE_PROVIDER_SITE_OTHER): Payer: Medicare Other | Admitting: Family Medicine

## 2018-05-20 VITALS — BP 130/76 | HR 77 | Temp 98.2°F | Resp 16 | Wt 165.0 lb

## 2018-05-20 DIAGNOSIS — M7061 Trochanteric bursitis, right hip: Secondary | ICD-10-CM

## 2018-05-20 DIAGNOSIS — M545 Low back pain, unspecified: Secondary | ICD-10-CM

## 2018-05-20 DIAGNOSIS — L0291 Cutaneous abscess, unspecified: Secondary | ICD-10-CM | POA: Diagnosis not present

## 2018-05-20 MED ORDER — DOXYCYCLINE HYCLATE 100 MG PO TABS: 100.0000 mg | ORAL_TABLET | Freq: Two times a day (BID) | ORAL | 0 refills | Status: AC

## 2018-05-20 NOTE — Progress Notes (Signed)
Patient: April Stuart Female    DOB: 21-Apr-1951   67 y.o.   MRN: [id number] Visit Date: 05/20/2018  Today's Provider: Lavon Paganini, MD   I, Martha Clan, CMA, am acting as scribe for Lavon Paganini, MD.  Chief Complaint  Patient presents with  . Rash  . Back Pain   Subjective:    Rash  This is a recurrent (pt had the same rash about 1 yaer ago, that was improved with neosporin use) problem. Episode onset: x 3 days. The affected locations include the left buttock. The rash is characterized by redness and itchiness (raised above the skin). She was exposed to nothing. Associated symptoms include fatigue (probably unrelated). Pertinent negatives include no anorexia, congestion, cough, diarrhea, eye pain, facial edema, fever, joint pain, nail changes, rhinorrhea, shortness of breath, sore throat or vomiting. Past treatments include antibiotic cream. The treatment provided no relief.  Back Pain  This is a new problem. Episode onset: about 3 or more weeks. The problem occurs intermittently. The problem has been gradually worsening since onset. The pain is present in the gluteal (right). Radiates to: left gluteal and bilateral legs. Pain severity now: mild to severe. The symptoms are aggravated by sitting. Associated symptoms include leg pain. Pertinent negatives include no abdominal pain, bladder incontinence, bowel incontinence, chest pain, dysuria, fever, headaches, numbness, pelvic pain, tingling, weakness or weight loss. She has tried NSAIDs for the symptoms. The treatment provided moderate relief.       Allergies  Allergen Reactions  . Penicillins Shortness Of Breath and Swelling    Facial swelling  . Sulfa Antibiotics Swelling    Facial swelling  . Crestor [Rosuvastatin Calcium]     Myalgias   . Lipitor [Atorvastatin]     myalgias     Current Outpatient Medications:  .  Biotin 1000 MCG CHEW, Chew by mouth., Disp: , Rfl:  .  cholecalciferol (VITAMIN D) 1000  units tablet, Take 1,000 Units daily by mouth., Disp: , Rfl:  .  Multiple Vitamins-Minerals (CENTRUM ADULTS PO), Take by mouth., Disp: , Rfl:   Review of Systems  Constitutional: Positive for fatigue (probably unrelated). Negative for fever and weight loss.  HENT: Negative for congestion, rhinorrhea and sore throat.   Eyes: Negative for pain.  Respiratory: Negative for cough and shortness of breath.   Cardiovascular: Negative for chest pain.  Gastrointestinal: Negative for abdominal pain, anorexia, bowel incontinence, diarrhea and vomiting.  Genitourinary: Negative for bladder incontinence, dysuria and pelvic pain.  Musculoskeletal: Positive for back pain. Negative for joint pain.  Skin: Positive for rash. Negative for nail changes.  Neurological: Negative for tingling, weakness, numbness and headaches.    Social History   Tobacco Use  . Smoking status: Former Smoker    Packs/day: 2.00    Years: 38.00    Pack years: 76.00    Types: Cigarettes    Last attempt to quit: 10/26/1997    Years since quitting: 20.5  . Smokeless tobacco: Never Used  Substance Use Topics  . Alcohol use: Yes    Alcohol/week: 3.0 - 4.2 oz    Types: 5 - 7 Glasses of wine per week   Objective:   BP 130/76 (BP Location: Left Arm, Patient Position: Sitting, Cuff Size: Normal)   Pulse 77   Temp 98.2 F (36.8 C) (Oral)   Resp 16   Wt 165 lb (74.8 kg)   SpO2 99%   BMI 32.22 kg/m  Vitals:   05/20/18 1433  BP: 130/76  Pulse: 77  Resp: 16  Temp: 98.2 F (36.8 C)  TempSrc: Oral  SpO2: 99%  Weight: 165 lb (74.8 kg)     Physical Exam  Constitutional: She is oriented to person, place, and time. She appears well-developed and well-nourished. No distress.  HENT:  Head: Normocephalic and atraumatic.  Eyes: Conjunctivae are normal. No scleral icterus.  Neck: Neck supple. No thyromegaly present.  Cardiovascular: Normal rate, regular rhythm, normal heart sounds and intact distal pulses.  No murmur  heard. Pulmonary/Chest: Effort normal and breath sounds normal. No respiratory distress. She has no wheezes. She has no rales.  Abdominal: Soft. She exhibits no distension. There is no tenderness.  Musculoskeletal: She exhibits no edema.  Back: ROM intact.  No midline or paraspinal tenderness.  Neg straight leg raise b/l.  Strength intact in LEs.  Sensation intact grossly to light touch. TTP over R greater trochanter.  Lymphadenopathy:    She has no cervical adenopathy.  Neurological: She is alert and oriented to person, place, and time.  Skin: Skin is warm and dry. Capillary refill takes less than 2 seconds.  2cm abscess of L buttock.  Draining purulent fluid.  No fluctuance, +induration and erythema  Psychiatric: She has a normal mood and affect. Her behavior is normal.  Vitals reviewed.      Assessment & Plan:   1. Acute bilateral low back pain without sciatica - Bilateral low back pain without sciatica with no red flag symptoms and benign exam today -Discussed importance of stretching and core strengthening - Can continue treatment with Aleve as this is helping -Discussed that Aleve can be taken twice daily if needed  2. Greater trochanteric bursitis of right hip - Patient with significant tenderness over right greater trochanteric bursa -Discussed with patient what trochanteric bursitis is and how this occurs -Given home exercise plan for back pain and bursitis -Continue Aleve as above -Discussed that if this does not improve, she can come back for appointment for corticosteroid injection  3. Abscess - treat with doxycycline BID x7d - patient has penicillin and sulfa allergies - sitz baths or warm compresses - no indication for I&D as it is not fluctuant and is already draining - return precautions discussed    Meds ordered this encounter  Medications  . doxycycline (VIBRA-TABS) 100 MG tablet    Sig: Take 1 tablet (100 mg total) by mouth 2 (two) times daily for 7 days.      Dispense:  14 tablet    Refill:  0     Return if symptoms worsen or fail to improve.   The entirety of the information documented in the History of Present Illness, Review of Systems and Physical Exam were personally obtained by me. Portions of this information were initially documented by Raquel Sarna Ratchford, CMA and reviewed by me for thoroughness and accuracy.    Virginia Crews, MD, MPH [facility] 05/20/2018 3:14 PM

## 2018-05-20 NOTE — Patient Instructions (Signed)
The CDC recommends two doses of Shingrix (the shingles vaccine) separated by 2 to 6 months for adults age 68 years and older. I recommend checking with your insurance plan regarding coverage for this vaccine.     Skin Abscess A skin abscess is an infected area on or under your skin that contains a collection of pus and other material. An abscess may also be called a furuncle, carbuncle, or boil. An abscess can occur in or on almost any part of your body. Some abscesses break open (rupture) on their own. Most continue to get worse unless they are treated. The infection can spread deeper into the body and eventually into your blood, which can make you feel ill. Treatment usually involves draining the abscess. What are the causes? An abscess occurs when germs, often bacteria, pass through your skin and cause an infection. This may be caused by:  A scrape or cut on your skin.  A puncture wound through your skin, including a needle injection.  Blocked oil or sweat glands.  Blocked and infected hair follicles.  A cyst that forms beneath your skin (sebaceous cyst) and becomes infected.  What increases the risk? This condition is more likely to develop in people who:  Have a weak body defense system (immune system).  Have diabetes.  Have dry and irritated skin.  Get frequent injections or use illegal IV drugs.  Have a foreign body in a wound, such as a splinter.  Have problems with their lymph system or veins.  What are the signs or symptoms? An abscess may start as a painful, firm bump under the skin. Over time, the abscess may get larger or become softer. Pus may appear at the top of the abscess, causing pressure and pain. It may eventually break through the skin and drain. Other symptoms include:  Redness.  Warmth.  Swelling.  Tenderness.  A sore on the skin.  How is this diagnosed? This condition is diagnosed based on your medical history and a physical exam. A sample  of pus may be taken from the abscess to find out what is causing the infection and what antibiotics can be used to treat it. You also may have:  Blood tests to look for signs of infection or spread of an infection to your blood.  Imaging studies such as ultrasound, CT scan, or MRI if the abscess is deep.  How is this treated? Small abscesses that drain on their own may not need treatment. Treatment for an abscess that does not rupture on its own may include:  Warm compresses applied to the area several times per day.  Incision and drainage. Your health care provider will make an incision to open the abscess and will remove pus and any foreign body or dead tissue. The incision area may be packed with gauze to keep it open for a few days while it heals.  Antibiotic medicines to treat infection. For a severe abscess, you may first get antibiotics through an IV and then change to oral antibiotics.  Follow these instructions at home: Abscess Care  If you have an abscess that has not drained, place a warm, clean, wet washcloth over the abscess several times a day. Do this as told by your health care provider.  Follow instructions from your health care provider about how to take care of your abscess. Make sure you: ? Cover the abscess with a bandage (dressing). ? Change your dressing or gauze as told by your health care provider. ?  Wash your hands with soap and water before you change the dressing or gauze. If soap and water are not available, use hand sanitizer.  Check your abscess every day for signs of a worsening infection. Check for: ? More redness, swelling, or pain. ? More fluid or blood. ? Warmth. ? More pus or a bad smell. Medicines  Take over-the-counter and prescription medicines only as told by your health care provider.  If you were prescribed an antibiotic medicine, take it as told by your health care provider. Do not stop taking the antibiotic even if you start to feel  better. General instructions  To avoid spreading the infection: ? Do not share personal care items, towels, or hot tubs with others. ? Avoid making skin contact with other people.  Keep all follow-up visits as told by your health care provider. This is important. Contact a health care provider if:  You have more redness, swelling, or pain around your abscess.  You have more fluid or blood coming from your abscess.  Your abscess feels warm to the touch.  You have more pus or a bad smell coming from your abscess.  You have a fever.  You have muscle aches.  You have chills or a general ill feeling. Get help right away if:  You have severe pain.  You see red streaks on your skin spreading away from the abscess. This information is not intended to replace advice given to you by your health care provider. Make sure you discuss any questions you have with your health care provider. Document Released: 07/23/2005 Document Revised: 06/08/2016 Document Reviewed: 08/22/2015 Elsevier Interactive Patient Education  [employer].

## 2018-07-20 ENCOUNTER — Other Ambulatory Visit: Payer: Self-pay | Admitting: Family Medicine

## 2018-07-20 DIAGNOSIS — Z1231 Encounter for screening mammogram for malignant neoplasm of breast: Secondary | ICD-10-CM

## 2018-07-29 ENCOUNTER — Ambulatory Visit (INDEPENDENT_AMBULATORY_CARE_PROVIDER_SITE_OTHER): Payer: Medicare Other

## 2018-07-29 VITALS — BP 120/76 | HR 82 | Temp 98.2°F | Ht 60.0 in | Wt 169.0 lb

## 2018-07-29 DIAGNOSIS — Z1159 Encounter for screening for other viral diseases: Secondary | ICD-10-CM | POA: Diagnosis not present

## 2018-07-29 DIAGNOSIS — Z23 Encounter for immunization: Secondary | ICD-10-CM | POA: Diagnosis not present

## 2018-07-29 DIAGNOSIS — Z0000 Encounter for general adult medical examination without abnormal findings: Secondary | ICD-10-CM

## 2018-07-29 NOTE — Progress Notes (Signed)
Subjective:   April Stuart is a 67 y.o. female who presents for Medicare Annual (Subsequent) preventive examination.  Review of Systems:  N/A Cardiac Risk Factors include: advanced age (>51men, >49 women);obesity (BMI >30kg/m2)     Objective:     Vitals: BP 120/76 (BP Location: Left Arm)   Pulse 82   Temp 98.2 F (36.8 C) (Oral)   Ht 5' (1.524 m)   Wt 169 lb (76.7 kg)   BMI 33.01 kg/m   Body mass index is 33.01 kg/m.  Advanced Directives 07/29/2018 08/31/2017 07/23/2017  Does Patient Have a Medical Advance Directive? No;Yes No No  Type of Paramedic of [place];Living will - -  Copy of [place] in Chart? No - copy requested - -    Tobacco Social History   Tobacco Use  Smoking Status Former Smoker  . Packs/day: 2.00  . Years: 38.00  . Pack years: 76.00  . Types: Cigarettes  . Last attempt to quit: 10/26/1997  . Years since quitting: 20.7  Smokeless Tobacco Never Used     Counseling given: Not Answered   Clinical Intake:  Pre-visit preparation completed: Yes  Pain : No/denies pain Pain Score: 0-No pain     Nutritional Status: BMI > 30  Obese Nutritional Risks: None Diabetes: No  How often do you need to have someone help you when you read instructions, pamphlets, or other written materials from your doctor or pharmacy?: 1 - Never  Interpreter Needed?: No  Information entered by :: [facility], LPN  Past Medical History:  Diagnosis Date  . Breast cancer ([place]) 2008   R breast  . Personal history of chemotherapy 03/2007   right breast ca  . Personal history of radiation therapy 2008   right breast ca  . Thyroid nodule 2017   Past Surgical History:  Procedure Laterality Date  . APPENDECTOMY  1958  . BARTHOLIN GLAND CYST EXCISION  1978  . BREAST BIOPSY Right 03/19/2007   carcinoma  . BREAST BIOPSY Right 08/10/2013   rt breast 3:00 coil clip benign per pt done in [place], 9:00 wing clip benign per pt done in  [place] Right ?   ribbon marker benign  . BREAST EXCISIONAL BIOPSY Left 2017   pt states benign lymph nodes removed from axilla  . BREAST LUMPECTOMY Right 08/11/2007   with axillary dissection.   Marland Kitchen [place]   x [place]  . TUBAL LIGATION  1976   Family History  Problem Relation Age of Onset  . Dementia Mother   . Stroke Mother 15  . Alcohol abuse Mother   . Pancreatic cancer Father   . Prostate cancer Father   . HIV Brother   . Breast cancer Neg Hx   . Colon cancer Neg Hx    Social History   Socioeconomic History  . Marital status: Divorced    Spouse name: Not on file  . Number of children: 3  . Years of education: Not on file  . Highest education level: Master's degree (e.g., MA, MS, MEng, MEd, MSW, MBA)  Occupational History  . 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 Financial resource strain: Not hard at all  . Food insecurity:    Worry: Never true    Inability: Never true  . Transportation needs:    Medical: No    Non-medical:  No  Tobacco Use  . Smoking status: Former Smoker    Packs/day: 2.00    Years: 38.00    Pack years: 76.00    Types: Cigarettes    Last attempt to quit: 10/26/1997    Years since quitting: 20.7  . Smokeless tobacco: Never Used  Substance and Sexual Activity  . Alcohol use: Yes    Alcohol/week: 7.0 standard drinks    Types: 7 Glasses of wine per week  . Drug use: No  . Sexual activity: Never    Birth control/protection: Surgical  Lifestyle  . Physical activity:    Days per week: Not on file    Minutes per session: Not on file  . Stress: Not at all  Relationships  . Social connections:    Talks on phone: Not on file    Gets together: Not on file    Attends religious service: Not on file    Active member of club or organization: Not on file    Attends meetings of clubs or organizations: Not on file    Relationship  status: Not on file  Other Topics Concern  . Not on file  Social History Narrative  . Not on file    Outpatient Encounter Medications as of 07/29/2018  Medication Sig  . Biotin 5000 MCG CAPS Take 5,000 mcg by mouth daily.  . cholecalciferol (VITAMIN D) 1000 units tablet Take 2,000 Units by mouth daily.   . Multiple Vitamins-Minerals (CENTRUM ADULTS PO) Take by mouth.  . Biotin 1000 MCG CHEW Chew by mouth.   No facility-administered encounter medications on file as of 07/29/2018.     Activities of Daily Living In your present state of health, do you have any difficulty performing the following activities: 07/29/2018  Hearing? N  Vision? N  Difficulty concentrating or making decisions? N  Walking or climbing stairs? N  Dressing or bathing? N  Doing errands, shopping? N  Preparing Food and eating ? N  Using the Toilet? N  In the past six months, have you accidently leaked urine? N  Do you have problems with loss of bowel control? N  Managing your Medications? N  Managing your Finances? N  Housekeeping or managing your Housekeeping? N  Some recent data might be hidden    Patient Care Team: Virginia Crews, MD as PCP - General (Family Medicine) Beverly Gust, MD (Otolaryngology)    Assessment:   This is a routine wellness examination for April Stuart.  Exercise Activities and Dietary recommendations Current Exercise Habits: Structured exercise class, Type of exercise: walking;treadmill, Time (Minutes): 30, Frequency (Times/Week): 3, Weekly Exercise (Minutes/Week): 90, Intensity: Mild, Exercise limited by: None identified  Goals    . Prevent falls     Recommend to remove any items from the home that may cause slips or trips.          Fall Risk Fall Risk  07/29/2018 07/23/2017  Falls in the past year? Yes No  Number falls in past yr: 1 -  Injury with Fall? No -  Follow up Falls prevention discussed -   FALL RISK PREVENTION PERTAINING TO THE HOME:  Any stairs in or  around the home WITH handrails? No  Home free of loose throw rugs in walkways, pet beds, electrical cords, etc? Yes  Adequate lighting in your home to reduce risk of falls? Yes   ASSISTIVE DEVICES UTILIZED TO PREVENT FALLS:  Life alert? Yes  Use of a cane, walker or w/c? No  Grab bars in  the bathroom? No  Shower chair or bench in shower? No  Elevated toilet seat or a handicapped toilet? No   DME ORDERS:  DME order needed?  No   TIMED UP AND GO:  Was the test performed? No .   Depression Screen PHQ 2/9 Scores 07/29/2018 07/23/2017  PHQ - 2 Score 0 0     Cognitive Function     6CIT Screen 07/29/2018  What Year? 0 points  What month? 0 points  What time? 0 points  Count back from 20 0 points  Months in reverse 0 points  Repeat phrase 0 points  Total Score 0    Immunization History  Administered Date(s) Administered  . Influenza, High Dose Seasonal PF 07/23/2017  . Pneumococcal Conjugate-13 05/07/2015  . Pneumococcal Polysaccharide-23 05/09/2016  . Zoster 04/26/2010    Qualifies for Shingles Vaccine? Yes  Zostavax completed 04/26/2010. Due for Shingrix. Education has been provided regarding the importance of this vaccine. Pt has been advised to call insurance company to determine out of pocket expense. Advised may also receive vaccine at local pharmacy or Health Dept. Verbalized acceptance and understanding.  Tdap: Although this vaccine is not a covered service during a Wellness Exam, does the patient still wish to receive this vaccine today?  No .  Education has been provided regarding the importance of this vaccine. Advised may receive this vaccine at local pharmacy or Health Dept. Aware to provide a copy of the vaccination record if obtained from local pharmacy or Health Dept. Verbalized acceptance and understanding.  Flu Vaccine: Due for Flu vaccine. Does the patient want to receive this vaccine today?  Yes .   Screening Tests Health Maintenance  Topic Date Due  .  Hepatitis C Screening  [birth year]  . TETANUS/TDAP  03/07/1969  . MAMMOGRAM  08/20/2019  . COLONOSCOPY  07/03/2026  . INFLUENZA VACCINE  Completed  . DEXA SCAN  Completed  . PNA vac Low Risk Adult  Completed    Cancer Screenings:  Colorectal Screening: Completed 07/03/16. Repeat every 10 years.   Mammogram: Completed 08/20/17. Repeat every year.  Bone Density: Completed 08/19/17.   Lung Cancer Screening: (Low Dose CT Chest recommended if Age 34-80 years, 30 pack-year currently smoking OR have quit w/in 15years.) does not qualify.   Additional Screening:  Hepatitis C Screening: does qualify, ordered today.  Vision Screening: Recommended annual ophthalmology exams for early detection of glaucoma and other disorders of the eye. Is the patient up to date with their annual eye exam?  No   Dental Screening: Recommended annual dental exams for proper oral hygiene  Community Resource Referral:  CRR required this visit?  No       Plan:  I have personally reviewed and addressed the Medicare Annual Wellness questionnaire and have noted the following in the patient's chart:  A. Medical and social history B. Use of alcohol, tobacco or illicit drugs  C. Current medications and supplements D. Functional ability and status E.  Nutritional status F.  Physical activity G. Advance directives H. List of other physicians I.  Hospitalizations, surgeries, and ER visits in previous 12 months J.  [place] such as hearing and vision if needed, cognitive and depression L. Referrals and appointments - none  In addition, I have reviewed and discussed with patient certain preventive protocols, quality metrics, and best practice recommendations. A written personalized care plan for preventive services as well as general preventive health recommendations were provided to patient.  See attached scanned questionnaire  for additional information.   Signed,  Fabio Neighbors, LPN Nurse  Health Advisor   Nurse Recommendations: Pt declined the tetanus vaccine today. Hep C lab ordered today.

## 2018-07-29 NOTE — Addendum Note (Signed)
Addended by: Fabio Neighbors A on: 07/29/2018 02:18 PM   Modules accepted: Miquel Dunn

## 2018-07-29 NOTE — Patient Instructions (Addendum)
April Stuart , Thank you for taking time to come for your Medicare Wellness Visit. I appreciate your ongoing commitment to your health goals. Please review the following plan we discussed and let me know if I can assist you in the future.   Screening recommendations/referrals: Colonoscopy: Up to date Mammogram: Up to date Bone Density: Up to date Recommended yearly ophthalmology/optometry visit for glaucoma screening and checkup Recommended yearly dental visit for hygiene and checkup  Vaccinations: Influenza vaccine: Up to date Pneumococcal vaccine: Up to date Tdap vaccine: Pt declines today.  Shingles vaccine: Pt declines today.     Advanced directives: Please bring a copy of your POA (Power of Attorney) and/or Living Will to your next appointment.   Conditions/risks identified: Obesity; Fall risk - recommend to remove any items from the home that may cause slips or trips.  Next appointment: 08/09/18 @ 9 AM with Dr Brita Romp.    Preventive Care 77 Years and Older, Female Preventive care refers to lifestyle choices and visits with your health care provider that can promote health and wellness. What does preventive care include?  A yearly physical exam. This is also called an annual well check.  Dental exams once or twice a year.  Routine eye exams. Ask your health care provider how often you should have your eyes checked.  Personal lifestyle choices, including:  Daily care of your teeth and gums.  Regular physical activity.  Eating a healthy diet.  Avoiding tobacco and drug use.  Limiting alcohol use.  Practicing safe sex.  Taking low-dose aspirin every day.  Taking vitamin and mineral supplements as recommended by your health care provider. What happens during an annual well check? The services and screenings done by your health care provider during your annual well check will depend on your age, overall health, lifestyle risk factors, and family history of  disease. Counseling  Your health care provider may ask you questions about your:  Alcohol use.  Tobacco use.  Drug use.  Emotional well-being.  Home and relationship well-being.  Sexual activity.  Eating habits.  History of falls.  Memory and ability to understand (cognition).  Work and work Statistician.  Reproductive health. Screening  You may have the following tests or measurements:  Height, weight, and BMI.  Blood pressure.  Lipid and cholesterol levels. These may be checked every 5 years, or more frequently if you are over 28 years old.  Skin check.  Lung cancer screening. You may have this screening every year starting at age 94 if you have a 30-pack-year history of smoking and currently smoke or have quit within the past 15 years.  Fecal occult blood test (FOBT) of the stool. You may have this test every year starting at age 44.  Flexible sigmoidoscopy or colonoscopy. You may have a sigmoidoscopy every 5 years or a colonoscopy every 10 years starting at age 68.  Hepatitis C blood test.  Hepatitis B blood test.  Sexually transmitted disease (STD) testing.  Diabetes screening. This is done by checking your blood sugar (glucose) after you have not eaten for a while (fasting). You may have this done every 1-3 years.  Bone density scan. This is done to screen for osteoporosis. You may have this done starting at age 44.  Mammogram. This may be done every 1-2 years. Talk to your health care provider about how often you should have regular mammograms. Talk with your health care provider about your test results, treatment options, and if necessary, the need for  more tests. Vaccines  Your health care provider may recommend certain vaccines, such as:  Influenza vaccine. This is recommended every year.  Tetanus, diphtheria, and acellular pertussis (Tdap, Td) vaccine. You may need a Td booster every 10 years.  Zoster vaccine. You may need this after age  77.  Pneumococcal 13-valent conjugate (PCV13) vaccine. One dose is recommended after age 60.  Pneumococcal polysaccharide (PPSV23) vaccine. One dose is recommended after age 45. Talk to your health care provider about which screenings and vaccines you need and how often you need them. This information is not intended to replace advice given to you by your health care provider. Make sure you discuss any questions you have with your health care provider. Document Released: 11/09/2015 Document Revised: 07/02/2016 Document Reviewed: 08/14/2015 Elsevier Interactive Patient Education  2017 [employer] Prevention in the Home Falls can cause injuries. They can happen to people of all ages. There are many things you can do to make your home safe and to help prevent falls. What can I do on the outside of my home?  Regularly fix the edges of walkways and driveways and fix any cracks.  Remove anything that might make you trip as you walk through a door, such as a raised step or threshold.  Trim any bushes or trees on the path to your home.  Use bright outdoor lighting.  Clear any walking paths of anything that might make someone trip, such as rocks or tools.  Regularly check to see if handrails are loose or broken. Make sure that both sides of any steps have handrails.  Any raised decks and porches should have guardrails on the edges.  Have any leaves, snow, or ice cleared regularly.  Use sand or salt on walking paths during winter.  Clean up any spills in your garage right away. This includes oil or grease spills. What can I do in the bathroom?  Use night lights.  Install grab bars by the toilet and in the tub and shower. Do not use towel bars as grab bars.  Use non-skid mats or decals in the tub or shower.  If you need to sit down in the shower, use a plastic, non-slip stool.  Keep the floor dry. Clean up any water that spills on the floor as soon as it happens.  Remove  soap buildup in the tub or shower regularly.  Attach bath mats securely with double-sided non-slip rug tape.  Do not have throw rugs and other things on the floor that can make you trip. What can I do in the bedroom?  Use night lights.  Make sure that you have a light by your bed that is easy to reach.  Do not use any sheets or blankets that are too big for your bed. They should not hang down onto the floor.  Have a firm chair that has side arms. You can use this for support while you get dressed.  Do not have throw rugs and other things on the floor that can make you trip. What can I do in the kitchen?  Clean up any spills right away.  Avoid walking on wet floors.  Keep items that you use a lot in easy-to-reach places.  If you need to reach something above you, use a strong step stool that has a grab bar.  Keep electrical cords out of the way.  Do not use floor polish or wax that makes floors slippery. If you must use wax, use non-skid  floor wax.  Do not have throw rugs and other things on the floor that can make you trip. What can I do with my stairs?  Do not leave any items on the stairs.  Make sure that there are handrails on both sides of the stairs and use them. Fix handrails that are broken or loose. Make sure that handrails are as long as the stairways.  Check any carpeting to make sure that it is firmly attached to the stairs. Fix any carpet that is loose or worn.  Avoid having throw rugs at the top or bottom of the stairs. If you do have throw rugs, attach them to the floor with carpet tape.  Make sure that you have a light switch at the top of the stairs and the bottom of the stairs. If you do not have them, ask someone to add them for you. What else can I do to help prevent falls?  Wear shoes that:  Do not have high heels.  Have rubber bottoms.  Are comfortable and fit you well.  Are closed at the toe. Do not wear sandals.  If you use a  stepladder:  Make sure that it is fully opened. Do not climb a closed stepladder.  Make sure that both sides of the stepladder are locked into place.  Ask someone to hold it for you, if possible.  Clearly mark and make sure that you can see:  Any grab bars or handrails.  First and last steps.  Where the edge of each step is.  Use tools that help you move around (mobility aids) if they are needed. These include:  Canes.  Walkers.  Scooters.  Crutches.  Turn on the lights when you go into a dark area. Replace any light bulbs as soon as they burn out.  Set up your furniture so you have a clear path. Avoid moving your furniture around.  If any of your floors are uneven, fix them.  If there are any pets around you, be aware of where they are.  Review your medicines with your doctor. Some medicines can make you feel dizzy. This can increase your chance of falling. Ask your doctor what other things that you can do to help prevent falls. This information is not intended to replace advice given to you by your health care provider. Make sure you discuss any questions you have with your health care provider. Document Released: 08/09/2009 Document Revised: 03/20/2016 Document Reviewed: 11/17/2014 Elsevier Interactive Patient Education  2017 [employer].

## 2018-07-30 LAB — HEPATITIS C ANTIBODY

## 2018-08-09 ENCOUNTER — Encounter: Payer: Self-pay | Admitting: Family Medicine

## 2018-08-09 ENCOUNTER — Ambulatory Visit (INDEPENDENT_AMBULATORY_CARE_PROVIDER_SITE_OTHER): Payer: Medicare Other | Admitting: Family Medicine

## 2018-08-09 VITALS — BP 112/68 | HR 78 | Temp 98.2°F | Ht 60.0 in | Wt 166.4 lb

## 2018-08-09 DIAGNOSIS — M545 Low back pain, unspecified: Secondary | ICD-10-CM

## 2018-08-09 DIAGNOSIS — E041 Nontoxic single thyroid nodule: Secondary | ICD-10-CM

## 2018-08-09 DIAGNOSIS — Z6832 Body mass index (BMI) 32.0-32.9, adult: Secondary | ICD-10-CM

## 2018-08-09 DIAGNOSIS — E7800 Pure hypercholesterolemia, unspecified: Secondary | ICD-10-CM | POA: Diagnosis not present

## 2018-08-09 DIAGNOSIS — E669 Obesity, unspecified: Secondary | ICD-10-CM

## 2018-08-09 NOTE — Progress Notes (Signed)
Patient: April Stuart Female    DOB: Mar 17, 1951   67 y.o.   MRN: [id number] Visit Date: 08/09/2018  Today's Provider: Lavon Paganini, MD   Chief Complaint  Patient presents with  . Hyperlipidemia   Subjective:    I, Tiburcio Pea, CMA, am acting as a scribe for Lavon Paganini, MD.   HPI  Patient had a AWE with [facility] on 07/29/2018   Lipid/Cholesterol, Follow-up:   Last seen for this 1 years ago.  Management changes since that visit include no changes. . Last Lipid Panel:    Component Value Date/Time   CHOL 287 (H) 07/23/2017 1639   TRIG 168 (H) 07/23/2017 1639   HDL 61 07/23/2017 1639   CHOLHDL 4.7 07/23/2017 1639   LDLCALC 192 (H) 07/23/2017 1639    Risk factors for vascular disease include hypercholesterolemia  She reports good compliance with treatment. She is not having side effects.  Current symptoms include none Weight trend: stable Current diet: in general, a "healthy" diet   Current exercise: none  Wt Readings from Last 3 Encounters:  08/09/18 166 lb 6.4 oz (75.5 kg)  07/29/18 169 lb (76.7 kg)  05/20/18 165 lb (74.8 kg)    ------------------------------------------------------------------- Was seen 05/20/18 for R sided low back pain.  Tried to do HEP, but floors were too hard to lay on and she kept rolling around on bed when trying to do them.  Pain is constant, but worse at night.  Some stiffness.  Dull and achy.  Sometimes radiates into R thigh.  She is frustrated with her inability to exercise due to back pain.  She would like to lose weight     Allergies  Allergen Reactions  . Penicillins Shortness Of Breath and Swelling    Facial swelling  . Sulfa Antibiotics Swelling    Facial swelling  . Crestor [Rosuvastatin Calcium]     Myalgias   . Lipitor [Atorvastatin]     myalgias     Current Outpatient Medications:  .  Biotin 5000 MCG CAPS, Take 5,000 mcg by mouth daily., Disp: , Rfl:  .  cholecalciferol (VITAMIN D) 1000 units  tablet, Take 2,000 Units by mouth daily. , Disp: , Rfl:  .  Multiple Vitamins-Minerals (CENTRUM ADULTS PO), Take by mouth., Disp: , Rfl:    Review of Systems  Constitutional: Negative.   Respiratory: Negative.   Cardiovascular: Negative.   Musculoskeletal: Positive for myalgias (after a fall a couple weeks ago).    Social History   Tobacco Use  . Smoking status: Former Smoker    Packs/day: 2.00    Years: 38.00    Pack years: 76.00    Types: Cigarettes    Last attempt to quit: 10/26/1997    Years since quitting: 20.8  . Smokeless tobacco: Never Used  Substance Use Topics  . Alcohol use: Yes    Alcohol/week: 7.0 standard drinks    Types: 7 Glasses of wine per week   Objective:   BP 112/68 (BP Location: Right Arm, Patient Position: Sitting, Cuff Size: Normal)   Pulse 78   Temp 98.2 F (36.8 C) (Oral)   Ht 5' (1.524 m)   Wt 166 lb 6.4 oz (75.5 kg)   SpO2 97%   BMI 32.50 kg/m  Vitals:   08/09/18 0903  BP: 112/68  Pulse: 78  Temp: 98.2 F (36.8 C)  TempSrc: Oral  SpO2: 97%  Weight: 166 lb 6.4 oz (75.5 kg)  Height: 5' (1.524 m)  Physical Exam  Constitutional: She is oriented to person, place, and time. She appears well-developed and well-nourished. No distress.  HENT:  Head: Normocephalic and atraumatic.  Mouth/Throat: Oropharynx is clear and moist.  Eyes: Conjunctivae are normal. No scleral icterus.  Neck: Neck supple. No thyromegaly present.  Cardiovascular: Normal rate, regular rhythm, normal heart sounds and intact distal pulses.  No murmur heard. Pulmonary/Chest: Effort normal and breath sounds normal. No respiratory distress. She has no wheezes. She has no rales.  Musculoskeletal: She exhibits no edema.  Back: No midline TTP, ROM intact  Lymphadenopathy:    She has no cervical adenopathy.  Neurological: She is alert and oriented to person, place, and time.  Skin: Skin is warm and dry. Capillary refill takes less than 2 seconds. No rash noted.    Psychiatric: She has a normal mood and affect. Her behavior is normal.  Vitals reviewed.       Assessment & Plan:   Problem List Items Addressed This Visit      Endocrine   Thyroid nodule    Last [place] in 02/2018 with several nodules Several have had negative biopsies previously One needs annual f/u with [place] Will recheck in 02/2019 along with repeat TSH        Other   Hyperlipidemia - Primary    Repeat lipid panel and CMP Calculate ASCVD      Relevant Orders   Comprehensive metabolic panel   Lipid panel   Obesity    Discussed diet and exercise and healthy weight management       Other Visit Diagnoses    Acute right-sided low back pain without sciatica       Relevant Orders   Ambulatory referral to Physical Therapy       Return in about 6 months (around 02/08/2019) for chronic disease f/u.   The entirety of the information documented in the History of Present Illness, Review of Systems and Physical Exam were personally obtained by me. Portions of this information were initially documented by Tiburcio Pea, CMA and reviewed by me for thoroughness and accuracy.    Virginia Crews, MD, MPH [facility] 08/09/2018 11:45 AM

## 2018-08-09 NOTE — Assessment & Plan Note (Signed)
Last [place] in 02/2018 with several nodules Several have had negative biopsies previously One needs annual f/u with [place] Will recheck in 02/2019 along with repeat TSH

## 2018-08-09 NOTE — Patient Instructions (Signed)
Preventive Care 68 Years and Older, Female Preventive care refers to lifestyle choices and visits with your health care provider that can promote health and wellness. What does preventive care include?  A yearly physical exam. This is also called an annual well check.  Dental exams once or twice a year.  Routine eye exams. Ask your health care provider how often you should have your eyes checked.  Personal lifestyle choices, including: ? Daily care of your teeth and gums. ? Regular physical activity. ? Eating a healthy diet. ? Avoiding tobacco and drug use. ? Limiting alcohol use. ? Practicing safe sex. ? Taking low-dose aspirin every day. ? Taking vitamin and mineral supplements as recommended by your health care provider. What happens during an annual well check? The services and screenings done by your health care provider during your annual well check will depend on your age, overall health, lifestyle risk factors, and family history of disease. Counseling Your health care provider may ask you questions about your:  Alcohol use.  Tobacco use.  Drug use.  Emotional well-being.  Home and relationship well-being.  Sexual activity.  Eating habits.  History of falls.  Memory and ability to understand (cognition).  Work and work environment.  Reproductive health.  Screening You may have the following tests or measurements:  Height, weight, and BMI.  Blood pressure.  Lipid and cholesterol levels. These may be checked every 5 years, or more frequently if you are over 50 years old.  Skin check.  Lung cancer screening. You may have this screening every year starting at age 55 if you have a 30-pack-year history of smoking and currently smoke or have quit within the past 15 years.  Fecal occult blood test (FOBT) of the stool. You may have this test every year starting at age 50.  Flexible sigmoidoscopy or colonoscopy. You may have a sigmoidoscopy every 5 years or  a colonoscopy every 10 years starting at age 50.  Hepatitis C blood test.  Hepatitis B blood test.  Sexually transmitted disease (STD) testing.  Diabetes screening. This is done by checking your blood sugar (glucose) after you have not eaten for a while (fasting). You may have this done every 1-3 years.  Bone density scan. This is done to screen for osteoporosis. You may have this done starting at age 65.  Mammogram. This may be done every 1-2 years. Talk to your health care provider about how often you should have regular mammograms.  Talk with your health care provider about your test results, treatment options, and if necessary, the need for more tests. Vaccines Your health care provider may recommend certain vaccines, such as:  Influenza vaccine. This is recommended every year.  Tetanus, diphtheria, and acellular pertussis (Tdap, Td) vaccine. You may need a Td booster every 10 years.  Varicella vaccine. You may need this if you have not been vaccinated.  Zoster vaccine. You may need this after age 60.  Measles, mumps, and rubella (MMR) vaccine. You may need at least one dose of MMR if you were born in 1957 or later. You may also need a second dose.  Pneumococcal 13-valent conjugate (PCV13) vaccine. One dose is recommended after age 65.  Pneumococcal polysaccharide (PPSV23) vaccine. One dose is recommended after age 65.  Meningococcal vaccine. You may need this if you have certain conditions.  Hepatitis A vaccine. You may need this if you have certain conditions or if you travel or work in places where you may be exposed to hepatitis   A.  Hepatitis B vaccine. You may need this if you have certain conditions or if you travel or work in places where you may be exposed to hepatitis B.  Haemophilus influenzae type b (Hib) vaccine. You may need this if you have certain conditions.  Talk to your health care provider about which screenings and vaccines you need and how often you  need them. This information is not intended to replace advice given to you by your health care provider. Make sure you discuss any questions you have with your health care provider. Document Released: 11/09/2015 Document Revised: 07/02/2016 Document Reviewed: 08/14/2015 Elsevier Interactive Patient Education  2018 [employer]  

## 2018-08-09 NOTE — Assessment & Plan Note (Signed)
Discussed diet and exercise and healthy weight management 

## 2018-08-09 NOTE — Assessment & Plan Note (Signed)
Repeat lipid panel and CMP Calculate ASCVD

## 2018-08-13 DIAGNOSIS — E7800 Pure hypercholesterolemia, unspecified: Secondary | ICD-10-CM | POA: Diagnosis not present

## 2018-08-14 LAB — LIPID PANEL
CHOL/HDL RATIO: 4 ratio (ref 0.0–4.4)
CHOLESTEROL TOTAL: 242 mg/dL — AB (ref 100–199)
HDL: 60 mg/dL (ref >39)
LDL Calculated: 164 mg/dL — ABNORMAL HIGH (ref 0–99)
TRIGLYCERIDES: 91 mg/dL (ref 0–149)
VLDL CHOLESTEROL CAL: 18 mg/dL (ref 5–40)

## 2018-08-14 LAB — COMPREHENSIVE METABOLIC PANEL
ALK PHOS: 72 IU/L (ref 39–117)
ALT: 41 IU/L — AB (ref 0–32)
AST: 25 IU/L (ref 0–40)
Albumin/Globulin Ratio: 1.8 (ref 1.2–2.2)
Albumin: 4.2 g/dL (ref 3.6–4.8)
BILIRUBIN TOTAL: 0.3 mg/dL (ref 0.0–1.2)
BUN/Creatinine Ratio: 27 (ref 12–28)
BUN: 19 mg/dL (ref 8–27)
CHLORIDE: 103 mmol/L (ref 96–106)
CO2: 22 mmol/L (ref 20–29)
Calcium: 9.4 mg/dL (ref 8.7–10.3)
Creatinine, Ser: 0.71 mg/dL (ref 0.57–1.00)
GFR calc Af Amer: 101 mL/min/{1.73_m2} (ref >59)
GFR calc non Af Amer: 88 mL/min/{1.73_m2} (ref >59)
GLUCOSE: 109 mg/dL — AB (ref 65–99)
Globulin, Total: 2.3 g/dL (ref 1.5–4.5)
Potassium: 4.3 mmol/L (ref 3.5–5.2)
Sodium: 141 mmol/L (ref 134–144)
Total Protein: 6.5 g/dL (ref 6.0–8.5)

## 2018-08-23 ENCOUNTER — Ambulatory Visit
Admission: RE | Admit: 2018-08-23 | Discharge: 2018-08-23 | Disposition: A | Discharge status: Home / Self Care | Payer: Medicare Other | Source: Ambulatory Visit | Attending: Family Medicine | Admitting: Family Medicine

## 2018-08-23 DIAGNOSIS — Z1231 Encounter for screening mammogram for malignant neoplasm of breast: Secondary | ICD-10-CM | POA: Diagnosis not present

## 2018-08-25 ENCOUNTER — Encounter: Payer: Self-pay | Admitting: Physical Therapy

## 2018-08-25 ENCOUNTER — Ambulatory Visit: Payer: Medicare Other | Attending: Family Medicine | Admitting: Physical Therapy

## 2018-08-25 DIAGNOSIS — M5441 Lumbago with sciatica, right side: Secondary | ICD-10-CM | POA: Diagnosis not present

## 2018-08-25 DIAGNOSIS — R262 Difficulty in walking, not elsewhere classified: Secondary | ICD-10-CM | POA: Insufficient documentation

## 2018-08-25 NOTE — Therapy (Signed)
[facility] [address] [place], [address] Phone: [phone]   Fax:  [phone]  Physical Therapy Evaluation  Patient Details  Name: April Stuart MRN: [id number] Date of Birth: 1951/10/04 Referring Provider (PT): Virginia Crews, MD   Encounter Date: 08/25/2018  PT End of Session - 08/25/18 1757    Visit Number  1    Number of Visits  13    Date for PT Re-Evaluation  10/06/18    Authorization Type  Medicare    Authorization Time Period  Current Cert period: 88/41/6606 - 10/06/2018 (last PN: IE 08/25/2018)    Authorization - Visit Number  1    Authorization - Number of Visits  10    PT Start Time  1355    PT Stop Time  1445    PT Time Calculation (min)  50 min    Activity Tolerance  Patient tolerated treatment well;Patient limited by pain    Behavior During Therapy  [facility] for tasks assessed/performed       Past Medical History:  Diagnosis Date  . Breast cancer ([place]) 2008   R breast  . Personal history of chemotherapy 03/2007   right breast ca  . Personal history of radiation therapy 2008   right breast ca  . Thyroid nodule 2017    Past Surgical History:  Procedure Laterality Date  . APPENDECTOMY  1958  . BARTHOLIN GLAND CYST EXCISION  1978  . BREAST BIOPSY Right 03/19/2007   carcinoma  . BREAST BIOPSY Right 08/10/2013   rt breast 3:00 coil clip benign per pt done in [place], 9:00 wing clip benign per pt done in [place] Right ?   ribbon marker benign  . BREAST EXCISIONAL BIOPSY Left 2017   pt states benign lymph nodes removed from axilla  . BREAST LUMPECTOMY Right 08/11/2007   with axillary dissection.   Marland Kitchen [place] [facility]   x [place]  . TUBAL LIGATION  1976   SUBJECTIVE: HISTORY:  Patient is a 67 y.o. female who presents to outpatient physical therapy with a referral for acute right-sided low back pain without sciatica. This patient's  chief complaints consist of pain, stiffness, reduced activity tolerance after falling off of a treadmill in mid September 2019, leading to the following functional deficits: difficulty with sitting, standing, traveling, walking, completing usual fitness program, sleeping, changing positions and transferring.  Relevant past medical history and comorbidities include Breast cancer 2008, thyroid nodules (being monitored), obesity. Current medications include no prescriptions. See chart for supplements.  Imaging: none Response to previously administered skilled services: none provided.  Patient states condition started when she fell of the treadmill about 07/11/2018. She can usually use the treadmill fine but she stopped walking when she got a call. She fell off the back and hit her right rear side. Her right side was very painfull. She was taking a lot of advil and she went the doctor who gave her some exercises to do, which she did not do due having no comfortable place to do them at home.    She cannot sit or stand too long or work out the way she would like to work out, so she has gained some weight, which she is not happy about. She usually does aerobics (45 min -1 hour). She can do treadmill but not like before (cannot incline 2% like before and she cannot do it  as often as she did before or wants to). Can no longer sleep on right side, can sleep on left side. Sleep has become very restlest. Used to be TM 30-45 min 2.5 mph.   Last week she tried 30 minutes on the treadmill at 2.5 mph no incline but it took 2 days to recover. She feels like it is getting better over time. She has days she feels fine and days she feels like she can hardly move.   States she had breast cancer in 2008, stage II; treated with chemo, surgery, and radiation. 1 x a year (no problems since).   Patient-reported Outcome Measure: FOTO = 54/100; mODI =24% .   SPECIAL SCREENING QUESTIONS Dizziness, double vision, difficulty  swallowing, difficulty speaking, fainting spells or blackouts, facial numbness, or nausea: denies. Recent illness or fever: denies Recent unexplained weight loss: denies Night pain/sweats: denies. Recent bowel or bladder function abnormality: denies. Current symptoms/concerns not elsewhere described: denies.  There were no vitals filed for this visit.   Subjective Assessment - 08/25/18 1352    Subjective  Patient states condition started when she fell of the treadmill about 07/11/2018. She can usually use the treadmill fine but she stopped walking when she got a call. She fell off the back and hit her right rear side. Her right side was very painfull. She was taking a lot of advil and she went the doctor who gave her some exercises to do, which she did not do due having no comfortable place to do them at home.  She cannot sit or stand too long or work out the way she would like to work out, so she has gained some weight, which she is not happy about. She usually does aerobics (45 min -1 hour). She can do treadmill but not like before (cannot incline 2% like before and she cannot do it as often as she did before or wants to). Can no longer sleep on right side, can sleep on left side. Sleep has become very restlest. Used to be TM 30-45 min 2.5 mph. Last week she tried 30 minutes on the treadmill at 2.5 mph no incline but it took 2 days to recover. She feels like it is getting better over time. She has days she feels fine and days she feels like she can hardly move.     Pertinent History  Patient is a 67 y.o. female who presents to outpatient physical therapy with a referral for acute right-sided low back pain without sciatica. This patient's chief complaints consist of pain, stiffness, reduced activity tolerance after falling off of a treadmill in mid September 2019, leading to the following functional deficits: difficulty with sitting, standing, traveling, walking, completing usual fitness program,  sleeping, changing positions and transferring.      Limitations  Sitting;Lifting;Standing;Walking;Other (comment);House hold activities   exercise program   How long can you sit comfortably?  ~1 hour before changing positions. Hard/painful to get up after.     How long can you stand comfortably?  ~ 1 hour (making recipe in kitchen)    How long can you walk comfortably?  ~ not doing much walking. Walking slower (not briskly). 08/18/18: 30 minutes 2.5 mph no incline. Took 2 days to recover.     Diagnostic tests  none    Patient Stated Goals  return to normal routine without pain.     Currently in Pain?  Yes    Pain Score  2     Pain Location  Back    Pain Orientation  Right    Pain Descriptors / Indicators  Aching;Other (Comment)   other times more sharp   Pain Type  Acute pain    Pain Radiating Towards  intermittant in right posterior thigh and lower leg (not past heel) when back pain is worst. Never in the left.     Pain Onset  More than a month ago    Pain Frequency  Intermittent    Aggravating Factors   sitting, getting up after sitting, standing, walking, bending, donning shoes, working out.     Pain Relieving Factors  aleve, icyhot (mildy helped), changing positions    Effect of Pain on Daily Activities  Limits and slows her down. Unable to participate in her usual exercise program and active lifestyle.          [facility] PT Assessment - 08/25/18 0001      Assessment   Medical Diagnosis  cute right-sided low back pain without sciatica    Referring Provider (PT)  Brita Romp, Dionne Bucy, MD    Onset Date/Surgical Date  07/11/18    Hand Dominance  Right    Next MD Visit  none scheduled    Prior Therapy  no      Precautions   Precautions  None      Restrictions   Weight Bearing Restrictions  No      Balance Screen   Has the patient fallen in the past 6 months  Yes    How many times?  1    Has the patient had a decrease in activity level because of a fear of falling?   Yes    Is  the patient reluctant to leave their home because of a fear of falling?   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, spend time with friends.       Cognition   Overall Cognitive Status  Within Functional Limits for tasks assessed      Observation/Other Assessments   Observations  For latest objective data see note from 08/25/2018      OBJECTIVE: OBSERVATION/INSPECTION: Patient presents with reduced lumbar curve, forward lean, slight left lateral shift in standing.   NEUROLOGICAL: Dermatomes: BLE WNL to light touch. Myotomes: BLE WNL Reflexes:  - Quadriceps reflex (L4): R = 1+, L = absent - Achilles reflex (S1): B = absent Upper Motor Neuron Screen: Hoffman's and Clonus (ankle) negative bilaterally.  Neurodynamic testing:  - SLR R = (+) concordant sign; L = (+) neural tension (pain in hamstring region). Both responsive to sensitizing maneuver.   SPINE MOTION: Lumbar AROM: - Flexion: = fingers just below patellas, painful, worse.  - Extension: = 25%, stiff, no effect, no effect - Side glide: R = 50% limited, painnful, worse, L = 50%. Marland Kitchen  PERIPHERAL JOINT MOTION (AROM/PROM in degrees): Hip  - B hips WNL and bilaterally equal except some end range pain with right hip ER, and hips moderately restricted  in extension.  Knee/Ankle grossly WFL. Marland Kitchen  STRENGTH:  - BLE grossly 4+/5 except the following:  - Right hip abduction = 4-/5; left hip abduction = 4/5.  MDT REPEATED MOTIONS TESTING  TEST MOVEMENTS (describe effects on present pain - During: produces, abolishes, increases, decreases, no effect, centralizing,  peripheralizing. After: better, worse, no better, no worse, no effect, centralized, peripheralized) - Repeated lumbar extension in standing 2x10; During = centralizing; After = centralized to low back, mildly improved extension ROM.  - Repeated lumbar extension in prone (prone press up) x 10, very limited due to stiffness and pt found uncomfortable in that position generally with possible reserved effort. No obvious effect. Did not peripheralize, increased central discomfort.  - lying prone on elbows x 5 min: during = centralizing; after = centralized to proximal glute.    SPECIAL TESTS: Thigh thrust: B = negative FABER: R = pain in low back  ACCESSORY MOTION:  - Tender to CPA over lower 3 lumbar segments, worst at most caudal segments without reproduction of right hip or leg pain.  PALPATION: - TTP over soft tissue near right SIJ and mild tenderness in right sciatic notch compared to left.    Objective measurements completed on examination: See above findings.    TREATMENT:   Therapeutic exercise: to centralize symptoms and improve ROM and strength required for successful completion of functional activities.  - Repeated lumbar extension in standing 2x10; During = centralizing; After = centralized to low back, mildly improved extension ROM.  - Repeated lumbar extension in prone (prone press up) x 10, very limited due to stiffness and pt found uncomfortable in that position generally with possible reserved effort. No obvious effect. Did not peripheralize, increased central discomfort.  - lying prone on elbows x 5 min: during = centralizing; after = centralized to proximal glute.  - trial of seated posture correction with lumbar roll and education on how and when to use at home.  -Education on diagnosis, prognosis, POC, anatomy and physiology of current condition.  -Education on HEP including handout   Patient response to treatment:  Pt tolerated initial eval and treatment well with some  increase in central low back pain. Patient was hesitant to complete exercises that required lying on the floor in her home and was more willing to complete standing extension exercise. She reported that her leg and at times her back felt better with repeated lumbar extension but seemed displeased to experience pain from stretching in new ways. Will follow up on this at next visit. Pt required cuing for proper technique and to facilitate improved neuromuscular control, strength, range of motion, and functional ability.   PT Education - 08/25/18 1456    Person(s) Educated  Patient    Methods  Explanation;Demonstration;Verbal cues;Handout    Comprehension  Returned demonstration;Verbalized understanding       PT Short Term Goals - 08/25/18 1820      PT SHORT TERM GOAL #1   Title  Be independent with home exercise program completed at least 3 times per week for self-management of symptoms.    Baseline  initial HEP provided (08/25/2018);    Time  2    Period  Weeks    Status  New    Target Date  09/08/18        PT Long Term Goals - 08/25/18 1822      PT LONG TERM GOAL #1   Title  Improve Modified Oswestry Disability Index score to equal or less than 10%  to demonstrate improved self reported function.     Baseline  24% (08/25/2018);    Time  6    Period  Weeks    Status  New    Target Date  10/06/18      PT LONG TERM GOAL #2   Title  Complete community, work and/or recreational activities without limitation due to current condition.     Baseline  unable to exercise, cook, or go about usual activities at home and at work without difficulty due to condition (08/25/2018);    Status  New    Target Date  08/31/18      PT LONG TERM GOAL #3   Title  Reduce pain with functional activities to equal or less than 1/10 to allow patient to complete usual activities including ADLs, IADLs, and social engagement with less difficulty.     Baseline  7/10 (08/25/2018);     Time  6    Period  Weeks     Status  New    Target Date  10/06/18      PT LONG TERM GOAL #4   Title  Be independent with a long-term home exercise program for self-management of symptoms.     Baseline  initial HEP given (08/25/2018);     Time  6    Period  Weeks    Status  New    Target Date  10/06/18      PT LONG TERM GOAL #5   Title  Have full lumbar spine AROM with no increase in pain in all planes except intermittent end range discomfort to allow patient to complete valued activities with less difficulty.     Baseline  restricted to 25% in extension and provocation of symptoms in flexion (08/25/2018);    Time  6    Period  Weeks    Status  New    Target Date  10/06/18             Plan - 08/25/18 1814    Clinical Impression Statement  Patient is a 67 y.o. female referred to outpatient physical therapy with a diagnosis of acute right-sided low back pain without sciatic who presents with signs and symptoms consistent with constant low back pain with intermittent referral down right lower extremity. Provisional MDT classification lumbar derangement with extension preference. Will confirm or abandon classification at future visits with continued assessment.    History and Personal Factors relevant to plan of care:  Comorbidities: Breast cancer 2008, thyroid nodules (being monitored), obesity. Personal factors: previous unwillingness to follow physician's advice for exercise due to not wanting to lay on floor at home.     Clinical Presentation  Stable    Clinical Presentation due to:  Patient's presentation appears to be slowly improving.     Clinical Decision Making  Low    Rehab Potential  Good    Clinical Impairments Affecting Rehab Potential  (-) ambivalance at needing PT; (+) good health and level of fitness prior to injury and desire to get back to full function; leg symptoms are intermittant.     PT Frequency  2x / week    PT Duration  6 weeks    PT Treatment/Interventions  ADLs/Self Care Home  Management;Cryotherapy;Electrical Stimulation;Moist Heat;Gait training;Stair training;Balance training;Therapeutic exercise;Therapeutic activities;Functional mobility training;Neuromuscular re-education;Patient/family education;Manual techniques;Passive range of motion;Spinal Manipulations;Joint Manipulations;Other (comment)   joint moblizations grades I-V   PT Next Visit Plan  assess response to HEP and adjust/progress treatment according to presentation.  PT Home Exercise Plan  Medbridge Access Code: [email or web address]     Consulted and Agree with Plan of Care  Patient       Patient will benefit from skilled therapeutic intervention in order to improve the following deficits and impairments:  Decreased endurance, Decreased mobility, Difficulty walking, Increased muscle spasms, Decreased range of motion, Impaired perceived functional ability, Improper body mechanics, Obesity, Decreased activity tolerance, Decreased strength, Impaired flexibility, Pain, Postural dysfunction  Visit Diagnosis: Acute right-sided low back pain with right-sided sciatica  Difficulty in walking, not elsewhere classified     Problem List Patient Active Problem List   Diagnosis Date Noted  . 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 Obesity 07/24/2017  . History of breast cancer 07/23/2017  . Hyperlipidemia 07/23/2017  . Thyroid nodule 07/23/2017    Nancy Nordmann, PT, DPT 08/25/2018, 6:36 PM  [facility] [address], [place], [address] Phone: [phone]   Fax:  [phone]  Name: KEITHA KOLK MRN: [id number] Date of Birth: 1950/12/28

## 2018-08-25 NOTE — Patient Instructions (Signed)
HOME EXERCISE PROGRAM Medbridge Access Code: [email or web address]  URL: [email or web address]  Date: 08/25/2018  Prepared by: Rosita Kea   Exercises  Standing Lumbar Extension - 10-15 reps - 1 sets - 1 second hold - 4-6x daily  Seated Correct Posture

## 2018-08-31 ENCOUNTER — Ambulatory Visit: Payer: Medicare Other | Attending: Family Medicine | Admitting: Physical Therapy

## 2018-08-31 DIAGNOSIS — M5441 Lumbago with sciatica, right side: Secondary | ICD-10-CM | POA: Diagnosis not present

## 2018-08-31 DIAGNOSIS — R262 Difficulty in walking, not elsewhere classified: Secondary | ICD-10-CM

## 2018-08-31 NOTE — Therapy (Signed)
[facility] [address], [place], [address] Phone: [phone]   Fax:  [phone]  Physical Therapy Treatment  Patient Details  Name: April Stuart MRN: [id number] Date of Birth: 1951/05/13 Referring Provider (PT): Virginia Crews, MD   Encounter Date: 08/31/2018  PT End of Session - 08/31/18 1424    Visit Number  2    Number of Visits  13    Date for PT Re-Evaluation  10/06/18    Authorization Type  Medicare    Authorization Time Period  Current Cert period: 38/07/1750 - 10/06/2018 (last PN: IE 08/25/2018)    Authorization - Visit Number  2    Authorization - Number of Visits  10    PT Start Time  1345    PT Stop Time  1420    PT Time Calculation (min)  35 min    Activity Tolerance  Patient tolerated treatment well;Patient limited by fatigue    Behavior During Therapy  [facility] for tasks assessed/performed       Past Medical History:  Diagnosis Date  . Breast cancer ([place]) 2008   R breast  . Personal history of chemotherapy 03/2007   right breast ca  . Personal history of radiation therapy 2008   right breast ca  . Thyroid nodule 2017    Past Surgical History:  Procedure Laterality Date  . APPENDECTOMY  1958  . BARTHOLIN GLAND CYST EXCISION  1978  . BREAST BIOPSY Right 03/19/2007   carcinoma  . BREAST BIOPSY Right 08/10/2013   rt breast 3:00 coil clip benign per pt done in [place], 9:00 wing clip benign per pt done in [place] Right ?   ribbon marker benign  . BREAST EXCISIONAL BIOPSY Left 2017   pt states benign lymph nodes removed from axilla  . BREAST LUMPECTOMY Right 08/11/2007   with axillary dissection.   Marland Kitchen [place]   x [place]  . TUBAL LIGATION  1976    There were no vitals filed for this visit.  Subjective Assessment - 08/31/18 1349    Subjective  patient reports she is feeling well today. She was able to complete her HEP as  prescribed 6x a day for the last 3 days and is feeling great today and much better.  She prefers the standing lumbar extensions.  She felt a lot of pain on thursday and friday prior to being able to complete the exercises more faiithfully.  It is not as hard to change positions after prolonged sitting.     Pertinent History  Patient is a 67 y.o. female who presents to outpatient physical therapy with a referral for acute right-sided low back pain without sciatica. This patient's chief complaints consist of pain, stiffness, reduced activity tolerance after falling off of a treadmill in mid September 2019, leading to the following functional deficits: difficulty with sitting, standing, traveling, walking, completing usual fitness program, sleeping, changing positions and transferring.      Limitations  Sitting;Lifting;Standing;Walking;Other (comment);House hold activities   exercise program   How long can you sit comfortably?  ~1 hour before changing positions. Hard/painful to get up after.     How long can you stand comfortably?  ~ 1 hour (making recipe in kitchen)    How long can you walk comfortably?  ~ not doing much walking. Walking slower (not briskly). 08/18/18: 30 minutes 2.5 mph no incline. Took  2 days to recover.     Diagnostic tests  none    Patient Stated Goals  return to normal routine without pain.     Pain Score  1     Pain Location  Back    Pain Orientation  Right    Pain Descriptors / Indicators  Aching    Pain Onset  More than a month ago       PT Education - 08/31/18 1424    Education Details  correct execution of exercises, appropriate responses, how to start adding treadmill walking into routine    Person(s) Educated  Patient    Methods  Explanation;Demonstration    Comprehension  Verbalized understanding;Returned demonstration      TREATMENT:   Therapeutic exercise: to centralize symptoms and improve ROM and strength required for successful completion of functional  activities.  - Repeated lumbar extension in standing 3x10 - Repeated lumbar extension in prone (prone press up) 3x 10, able to lock arms 1-2 times after multiple reps, manual therapy, and treadmill walking. - Treadmill up to 2 mph at 0% grade. For improved lower extremity mobility, muscular endurance, and weightbearing activity tolerance; and to induce the analgesic effect of aerobic exercise, stimulate improved joint nutrition, and prepare body structures and systems for following interventions. x 6 minutes with education and discussion about how to gradually return to fitness program on treadmill. -Education on diagnosis, prognosis, POC, anatomy and physiology of current condition.  -Education on HEP including handout   Manual therapy: to reduce pain and tissue tension, improve range of motion, neuromodulation, in order to promote improved ability to complete functional activities. - prone CPA grade III-IV over thoracic spine and lumbar spine, focusing on most tender segments that reproduce pain at right upper pelvic region.  Patient response to treatment:  Pt tolerated treatment well with excellent increase in lumbar extension range of motion and no worsening of discomfort. Pt is making excellent progress overall and was able to ambulate on treadmill for 6 minutes, reporting that it "feels good." Patient was instructed in how to start introducing return to treadmill walking as tolerated to get back to her valued fitness activities. Patient much more eager to complete exercises today and tolerant of discomfort that produced local pain, no worse during press ups and manual. Patient reported she felt good at end of session. Pt required cuing for proper technique and to facilitate improved neuromuscular control, strength, range of motion, and functional ability.  PT Short Term Goals - 08/25/18 1820      PT SHORT TERM GOAL #1   Title  Be independent with home exercise program completed at least 3  times per week for self-management of symptoms.    Baseline  initial HEP provided (08/25/2018);    Time  2    Period  Weeks    Status  New    Target Date  09/08/18        PT Long Term Goals - 08/25/18 1822      PT LONG TERM GOAL #1   Title  Improve Modified Oswestry Disability Index score to equal or less than 10% to demonstrate improved self reported function.     Baseline  24% (08/25/2018);    Time  6    Period  Weeks    Status  New    Target Date  10/06/18      PT LONG TERM GOAL #2   Title  Complete community, work and/or recreational activities without limitation due to current  condition.     Baseline  unable to exercise, cook, or go about usual activities at home and at work without difficulty due to condition (08/25/2018);    Status  New    Target Date  08/31/18      PT LONG TERM GOAL #3   Title  Reduce pain with functional activities to equal or less than 1/10 to allow patient to complete usual activities including ADLs, IADLs, and social engagement with less difficulty.     Baseline  7/10 (08/25/2018);     Time  6    Period  Weeks    Status  New    Target Date  10/06/18      PT LONG TERM GOAL #4   Title  Be independent with a long-term home exercise program for self-management of symptoms.     Baseline  initial HEP given (08/25/2018);     Time  6    Period  Weeks    Status  New    Target Date  10/06/18      PT LONG TERM GOAL #5   Title  Have full lumbar spine AROM with no increase in pain in all planes except intermittent end range discomfort to allow patient to complete valued activities with less difficulty.     Baseline  restricted to 25% in extension and provocation of symptoms in flexion (08/25/2018);    Time  6    Period  Weeks    Status  New    Target Date  10/06/18            Plan - 08/31/18 1425    Clinical Impression Statement  Pt tolerated treatment well with excellent increase in lumbar extension range of motion and no worsening of  discomfort. Pt is making excellent progress overall and was able to ambulate on treadmill for 6 minutes, reporting that it "feels good." Patient was instructed in how to start introducing return to treadmill walking as tolerated to get back to her valued fitness activities. Patient much more eager to complete exercises today and tolerant of discomfort that produced local pain, no worse during press ups and manual. Patient reported she felt good at end of session. Pt required cuing for proper technique and to facilitate improved neuromuscular control, strength, range of motion, and functional ability.    Rehab Potential  Good    Clinical Impairments Affecting Rehab Potential  (-) ambivalance at needing PT; (+) good health and level of fitness prior to injury and desire to get back to full function; leg symptoms are intermittant.     PT Frequency  2x / week    PT Duration  6 weeks    PT Treatment/Interventions  ADLs/Self Care Home Management;Cryotherapy;Electrical Stimulation;Moist Heat;Gait training;Stair training;Balance training;Therapeutic exercise;Therapeutic activities;Functional mobility training;Neuromuscular re-education;Patient/family education;Manual techniques;Passive range of motion;Spinal Manipulations;Joint Manipulations;Other (comment)   joint moblizations grades I-V   PT Next Visit Plan  assess response to HEP and adjust/progress treatment according to presentation. Consider reducing frequency to 1x per week if continuing to do well.     PT Home Exercise Plan  Medbridge Access Code: [email or web address]     Consulted and Agree with Plan of Care  Patient       Patient will benefit from skilled therapeutic intervention in order to improve the following deficits and impairments:  Decreased endurance, Decreased mobility, Difficulty walking, Increased muscle spasms, Decreased range of motion, Impaired perceived functional ability, Improper body mechanics, Obesity, Decreased activity tolerance, Decreased  strength, Impaired flexibility, Pain,  Postural dysfunction  Visit Diagnosis: Acute right-sided low back pain with right-sided sciatica  Difficulty in walking, not elsewhere classified     Problem List Patient Active Problem List   Diagnosis Date Noted  . 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 Obesity 07/24/2017  . History of breast cancer 07/23/2017  . Hyperlipidemia 07/23/2017  . Thyroid nodule 07/23/2017    Nancy Nordmann , PT, DPT 08/31/2018, 2:38 PM  [facility] [address] [place], [address] Phone: [phone]   Fax:  [phone]  Name: April Stuart MRN: [id number] Date of Birth: [birth year]

## 2018-09-02 ENCOUNTER — Ambulatory Visit: Payer: Medicare Other | Admitting: Physical Therapy

## 2018-09-02 ENCOUNTER — Encounter: Payer: Self-pay | Admitting: Physical Therapy

## 2018-09-02 DIAGNOSIS — R262 Difficulty in walking, not elsewhere classified: Secondary | ICD-10-CM | POA: Diagnosis not present

## 2018-09-02 DIAGNOSIS — M5441 Lumbago with sciatica, right side: Secondary | ICD-10-CM

## 2018-09-02 NOTE — Therapy (Signed)
[facility] [address] [place], [address] Phone: [phone]   Fax:  [phone]  Physical Therapy Treatment  Patient Details  Name: April Stuart MRN: [id number] Date of Birth: 1951-06-14 Referring Provider (PT): Virginia Crews, MD   Encounter Date: 09/02/2018  PT End of Session - 09/02/18 1348    Visit Number  3    Number of Visits  13    Date for PT Re-Evaluation  10/06/18    Authorization Type  Medicare    Authorization Time Period  Current Cert period: 34/28/7681 - 10/06/2018 (last PN: IE 08/25/2018)    Authorization - Visit Number  3    Authorization - Number of Visits  10    PT Start Time  1348    PT Stop Time  1435    PT Time Calculation (min)  47 min    Activity Tolerance  Patient tolerated treatment well;Patient limited by fatigue;Patient limited by pain    Behavior During Therapy  [facility] for tasks assessed/performed       Past Medical History:  Diagnosis Date  . Breast cancer ([place]) 2008   R breast  . Personal history of chemotherapy 03/2007   right breast ca  . Personal history of radiation therapy 2008   right breast ca  . Thyroid nodule 2017    Past Surgical History:  Procedure Laterality Date  . APPENDECTOMY  1958  . BARTHOLIN GLAND CYST EXCISION  1978  . BREAST BIOPSY Right 03/19/2007   carcinoma  . BREAST BIOPSY Right 08/10/2013   rt breast 3:00 coil clip benign per pt done in [place], 9:00 wing clip benign per pt done in [place] Right ?   ribbon marker benign  . BREAST EXCISIONAL BIOPSY Left 2017   pt states benign lymph nodes removed from axilla  . BREAST LUMPECTOMY Right 08/11/2007   with axillary dissection.   Marland Kitchen [place]   x [place]  . TUBAL LIGATION  1976    There were no vitals filed for this visit.  Subjective Assessment - 09/02/18 1347    Subjective  Patient is feeling much worse today. She started  feeling her pain increasing yesterday afternoon. It was not too bad this morning but as the day has progressed it has been feeling worse.  She tried her HEP a little bit this morning and it felt okay.  She did the stanidng lumbar extensions. Last night it felt like her entire low back hurt, not just her right side.  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     Pertinent History  Patient is a 67 y.o. female who presents to outpatient physical therapy with a referral for acute right-sided low back pain without sciatica. This patient's chief complaints consist of pain, stiffness, reduced activity tolerance after falling off of a treadmill in mid September 2019, leading to the following functional deficits: difficulty with sitting, standing, traveling, walking, completing usual fitness program, sleeping, changing positions and transferring.      Limitations  Sitting;Lifting;Standing;Walking;Other (comment);House hold activities   exercise program   How long can you sit comfortably?  ~1 hour before changing positions. Hard/painful to get up after.  How long can you stand comfortably?  ~ 1 hour (making recipe in kitchen)    How long can you walk comfortably?  ~ not doing much walking. Walking slower (not briskly). 08/18/18: 30 minutes 2.5 mph no incline. Took 2 days to recover.     Diagnostic tests  none    Patient Stated Goals  return to normal routine without pain.     Currently in Pain?  Yes    Pain Score  6     Pain Location  Back    Pain Orientation  Right    Pain Descriptors / Indicators  Aching    Pain Type  Acute pain    Pain Radiating Towards  intermittant in right posterior thigh to knee.     Pain Onset  More than a month ago       PT Education - 09/02/18 1445    Education Details  corrct form,  purpose of exercises, updated HEP    Person(s) Educated  Patient    Methods  Explanation;Demonstration;Tactile cues;Verbal cues;Handout    Comprehension  Verbalized understanding;Returned demonstration      TREATMENT:  Therapeutic exercise:to centralize symptoms and improve ROM and strength required for successful completion of functional activities.  - prone on elbows, x 5 min. Better (during subjective discussion).  - Repeated lumbar extension in standing 3x10 (5 reps of one set over edge of plinth for more support), pt continues to report this feels good.  - Repeated lumbar extension in prone (prone press up) 3x 10, able to lock arms 1-2 times after multiple reps, manual therapy. - prone press up with hips off center hips to the left X 10 with clinician lateral overpressure for 5 reps, ERP, no worse.  - hooklying lower trunk rotation, x 20 each side - flexion rotation self stretch (knees to right side), 10 second hold, x 2 with instructions on how to complete at home.  - standing side glide (left hip to wall) at wall, x15, ERP, no worse/mildly better following.  -Education on diagnosis, prognosis, POC, anatomy and physiology of current condition. -Education on HEP including handout  Manual therapy: to reduce pain and tissue tension, improve range of motion, neuromodulation, in order to promote improved ability to complete functional activities. - prone CPA grade III-IV over thoracic spine and lumbar spine, focusing on most tender segments that reproduce pain at right upper pelvic region. - flexion rotation mobilization x 2 knees to right, improved after first set, no better second set (but gait looks more painful).   HOME EXERCISE PROGRAM Access Code: [email or web address]  URL: [email or web address]  Date: 09/02/2018  Prepared by: Rosita Kea   Exercises  Seated Correct Posture  Standing Lumbar Extension - 10-15 reps - 1 sets - 1 second hold - 4-6x daily  Lumbar Flexion  Rotation with Self Overpressure - 10-15 reps - 1 second hold - 4x daily  Pain on Right-Standing Repeated Side Glide - 10-15 reps - 1 second hold - 4x daily   Patient response to treatment:  Pt toleratedtreatment well with continued demonstration of increasing lumbar extensiont. Pt with higher level of pain today with inability to completely abolish it. Pain decreased to 3/10 by end of session. Pt instructed in additional exercises for lateral component of pain due to mild positive response in clinic.  Pt required cuing for proper technique and to facilitate improved neuromuscular control, strength, range of motion, and functional ability. Patient overall progressing towards goals.    PT Short Term Goals - 08/25/18  [place] #1   Title  Be independent with home exercise program completed at least 3 times per week for self-management of symptoms.    Baseline  initial HEP provided (08/25/2018);    Time  2    Period  Weeks    Status  New    Target Date  09/08/18        PT Long Term Goals - 08/25/18 1822      PT LONG TERM GOAL #1   Title  Improve Modified Oswestry Disability Index score to equal or less than 10% to demonstrate improved self reported function.     Baseline  24% (08/25/2018);    Time  6    Period  Weeks    Status  New    Target Date  10/06/18      PT LONG TERM GOAL #2   Title  Complete community, work and/or recreational activities without limitation due to current condition.     Baseline  unable to exercise, cook, or go about usual activities at home and at work without difficulty due to condition (08/25/2018);    Status  New    Target Date  08/31/18      PT LONG TERM GOAL #3   Title  Reduce pain with functional activities to equal or less than 1/10 to allow patient to complete usual activities including ADLs, IADLs, and social engagement with less difficulty.     Baseline  7/10 (08/25/2018);     Time  6    Period  Weeks    Status  New     Target Date  10/06/18      PT LONG TERM GOAL #4   Title  Be independent with a long-term home exercise program for self-management of symptoms.     Baseline  initial HEP given (08/25/2018);     Time  6    Period  Weeks    Status  New    Target Date  10/06/18      PT LONG TERM GOAL #5   Title  Have full lumbar spine AROM with no increase in pain in all planes except intermittent end range discomfort to allow patient to complete valued activities with less difficulty.     Baseline  restricted to 25% in extension and provocation of symptoms in flexion (08/25/2018);    Time  6    Period  Weeks    Status  New    Target Date  10/06/18            Plan - 09/02/18 1444    Clinical Impression Statement  Pt toleratedtreatment well with continued demonstration of increasing lumbar extensiont. Pt with higher level of pain today with inability to completely abolish it. Pain decreased to 3/10 by end of session. Pt instructed in additional exercises for lateral component of pain due to mild positive response in clinic.  Pt required cuing for proper technique and to facilitate improved neuromuscular control, strength, range of motion, and functional ability. Patient overall progressing towards goals.     Rehab Potential  Good    Clinical Impairments Affecting Rehab Potential  (-) ambivalance at needing PT; (+) good health and level of fitness prior to injury and desire to get back to full function; leg symptoms are intermittant.     PT Frequency  2x / week    PT Duration  6 weeks    PT Treatment/Interventions  ADLs/Self Care Home Management;Cryotherapy;Dealer  Stimulation;Moist Heat;Gait training;Stair training;Balance training;Therapeutic exercise;Therapeutic activities;Functional mobility training;Neuromuscular re-education;Patient/family education;Manual techniques;Passive range of motion;Spinal Manipulations;Joint Manipulations;Other (comment)   joint moblizations grades I-V   PT Next Visit  Plan  assess response to HEP and adjust/progress treatment according to presentation.     PT Home Exercise Plan  Medbridge Access Code: [email or web address]     Consulted and Agree with Plan of Care  Patient       Patient will benefit from skilled therapeutic intervention in order to improve the following deficits and impairments:  Decreased endurance, Decreased mobility, Difficulty walking, Increased muscle spasms, Decreased range of motion, Impaired perceived functional ability, Improper body mechanics, Obesity, Decreased activity tolerance, Decreased strength, Impaired flexibility, Pain, Postural dysfunction  Visit Diagnosis: Acute right-sided low back pain with right-sided sciatica  Difficulty in walking, not elsewhere classified     Problem List Patient Active Problem List   Diagnosis Date Noted  . 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 Obesity 07/24/2017  . History of breast cancer 07/23/2017  . Hyperlipidemia 07/23/2017  . Thyroid nodule 07/23/2017    Nancy Nordmann, PT, DPT 09/02/2018, 2:45 PM  [facility] [address], [place], [address] Phone: [phone]   Fax:  [phone]  Name: ATLANTIS DELONG MRN: [id number] Date of Birth: 1951-05-31

## 2018-09-07 ENCOUNTER — Encounter: Payer: Self-pay | Admitting: Physical Therapy

## 2018-09-07 ENCOUNTER — Ambulatory Visit: Payer: Medicare Other | Admitting: Physical Therapy

## 2018-09-07 DIAGNOSIS — R262 Difficulty in walking, not elsewhere classified: Secondary | ICD-10-CM | POA: Diagnosis not present

## 2018-09-07 DIAGNOSIS — M5441 Lumbago with sciatica, right side: Secondary | ICD-10-CM

## 2018-09-07 NOTE — Therapy (Signed)
[facility] [address], [place], [address] Phone: [phone]   Fax:  [phone]  Physical Therapy Treatment  Patient Details  Name: April Stuart MRN: [id number] Date of Birth: 1951-05-10 Referring Provider (PT): Virginia Crews, MD   Encounter Date: 09/07/2018  PT End of Session - 09/07/18 1343    Visit Number  4    Number of Visits  13    Date for PT Re-Evaluation  10/06/18    Authorization Type  Medicare    Authorization Time Period  Current Cert period: 37/29/0211 - 10/06/2018 (last PN: IE 08/25/2018)    Authorization - Visit Number  4    Authorization - Number of Visits  10    PT Start Time  1345    PT Stop Time  1430    PT Time Calculation (min)  45 min    Activity Tolerance  Patient tolerated treatment well;Patient limited by fatigue;Patient limited by pain    Behavior During Therapy  [facility] for tasks assessed/performed       Past Medical History:  Diagnosis Date  . Breast cancer ([place]) 2008   R breast  . Personal history of chemotherapy 03/2007   right breast ca  . Personal history of radiation therapy 2008   right breast ca  . Thyroid nodule 2017    Past Surgical History:  Procedure Laterality Date  . APPENDECTOMY  1958  . BARTHOLIN GLAND CYST EXCISION  1978  . BREAST BIOPSY Right 03/19/2007   carcinoma  . BREAST BIOPSY Right 08/10/2013   rt breast 3:00 coil clip benign per pt done in [place], 9:00 wing clip benign per pt done in [place] Right ?   ribbon marker benign  . BREAST EXCISIONAL BIOPSY Left 2017   pt states benign lymph nodes removed from axilla  . BREAST LUMPECTOMY Right 08/11/2007   with axillary dissection.   Marland Kitchen [place]   x [place]  . TUBAL LIGATION  1976    There were no vitals filed for this visit.  Subjective Assessment - 09/07/18 1343    Subjective  Patient states she is feeling well today and her pain  is less. She has some increased pain on Saturday when she sat for a long time. She has been doing her HEP as prescribed on the other days with good resuslts. She states she felt good after last treatment session.     Pertinent History  Patient is a 67 y.o. female who presents to outpatient physical therapy with a referral for acute right-sided low back pain without sciatica. This patient's chief complaints consist of pain, stiffness, reduced activity tolerance after falling off of a treadmill in mid September 2019, leading to the following functional deficits: difficulty with sitting, standing, traveling, walking, completing usual fitness program, sleeping, changing positions and transferring.      Limitations  Sitting;Lifting;Standing;Walking;Other (comment);House hold activities   exercise program   How long can you sit comfortably?  ~1 hour before changing positions. Hard/painful to get up after.     How long can you stand comfortably?  ~ 1 hour (making recipe in kitchen)    How long can you walk comfortably?  ~ not doing much walking. Walking slower (not briskly). 08/18/18: 30 minutes 2.5 mph no incline. Took 2 days to recover.     Diagnostic tests  none    Patient Stated Goals  return to normal routine without pain.     Currently in Pain?  No/denies    Pain Onset  More than a month ago        PT Education - 09/07/18 1343    Education Details  correct for, purpose of exercises    Person(s) Educated  Patient    Methods  Explanation;Demonstration;Tactile cues    Comprehension  Verbalized understanding;Returned demonstration      TREATMENT:  Denies sensitivity to latex.   Therapeutic exercise:to centralize symptoms and improve ROM and strength required for successful completion of functional activities.  -Treadmill 2.5 mph with no grade. For improved lower extremity mobility, muscular endurance, and weightbearing activity tolerance; and to induce the analgesic effect of aerobic  exercise, stimulate improved joint nutrition, and prepare body structures and systems for following interventions. x 5  Minutes during subjective exam.  - Repeated lumbar extension in standing,4x10, (1 at start and 3 end not visit), pt continues to report this feels good.  - Repeated lumbar extension in prone (prone press up)2x 10,able to lock arms at the start. -Standing rows with scapular retraction for improved postural and shoulder girdle and posterior trunk strengthening and mobility. Required instruction for technique and cuing to retract, posteriorly tilt, and depress scapulae. sets x reps. x10 at 5#, x 10 at 10# -Standing pallof press (multifidus press) with green theraband 2x10 each side. Cuing for trunk.  - standing B SHLD pull down with extended elbows and abdominal brace, for anterior trunk strengthening, 2x10, 10# cable pull. - hooklying hip flexor stretch with target leg off edge of table and strap on ankle pulling knee into flexion, 3x10 seconds each side.  - Supine lower trunk rotation, 1 second holds, x16 each side.    Manual therapy:to reduce pain and tissue tension, improve range of motion, neuromodulation, in order to promote improved ability to complete functional activities. - prone CPA grade III-IV over thoracic spine and lumbar spine, focusing on most tender segments that reproduce pain at right upper pelvic region. - Hooklying Figure 4 stretch, 3x20 seconds each side with manual overpressure.   HOME EXERCISE PROGRAM Access Code: [email or web address]  URL: [email or web address]  Date: 09/02/2018  Prepared by: Rosita Kea   Exercises   Seated Correct Posture   Standing Lumbar Extension - 10-15 reps - 1 sets - 1 second hold - 4-6x daily   Lumbar Flexion Rotation with Self Overpressure - 10-15 reps - 1 second hold - 4x daily   Pain on Right-Standing Repeated Side Glide - 10-15 reps - 1 second hold - 4x daily   Patient response to treatment:  Pt  toleratedtreatment wellwith continued demonstration of increasing lumbar extension ability. Reported increase in pain from 0/10 at start of visit to 4/10 "soreness" across lower back evenly distributed and slightly relieved with standing extension exercise. No abnormality noted in gait. Pt able to engage in light trunk strengthening activities today but stated they felt very challenging and heavy after. During stated they were challenging but not too difficulty. Pt instructed in subtraction of lateral exercises for HEP due to good results not performing them since last visit. Pt continues to report relief from 6x/day repeated lumbar extension in standing (15 reps each time). Pt required cuing for proper technique and to facilitate improved neuromuscular control, strength, range of motion, and functional ability. Patient overall progressing towards goals.   PT Short Term Goals - 09/07/18 1432      PT SHORT TERM GOAL #1   Title  Be independent  with home exercise program completed at least 3 times per week for self-management of symptoms.    Baseline  initial HEP provided (08/25/2018); pt reported completing consistently 09/07/2018;     Time  2    Period  Weeks    Status  Achieved    Target Date  09/08/18        PT Long Term Goals - 08/25/18 1822      PT LONG TERM GOAL #1   Title  Improve Modified Oswestry Disability Index score to equal or less than 10% to demonstrate improved self reported function.     Baseline  24% (08/25/2018);    Time  6    Period  Weeks    Status  New    Target Date  10/06/18      PT LONG TERM GOAL #2   Title  Complete community, work and/or recreational activities without limitation due to current condition.     Baseline  unable to exercise, cook, or go about usual activities at home and at work without difficulty due to condition (08/25/2018);    Status  New    Target Date  08/31/18      PT LONG TERM GOAL #3   Title  Reduce pain with functional activities to  equal or less than 1/10 to allow patient to complete usual activities including ADLs, IADLs, and social engagement with less difficulty.     Baseline  7/10 (08/25/2018);     Time  6    Period  Weeks    Status  New    Target Date  10/06/18      PT LONG TERM GOAL #4   Title  Be independent with a long-term home exercise program for self-management of symptoms.     Baseline  initial HEP given (08/25/2018);     Time  6    Period  Weeks    Status  New    Target Date  10/06/18      PT LONG TERM GOAL #5   Title  Have full lumbar spine AROM with no increase in pain in all planes except intermittent end range discomfort to allow patient to complete valued activities with less difficulty.     Baseline  restricted to 25% in extension and provocation of symptoms in flexion (08/25/2018);    Time  6    Period  Weeks    Status  New    Target Date  10/06/18            Plan - 09/07/18 1343    Clinical Impression Statement  Pt toleratedtreatment wellwith continued demonstration of increasing lumbar extension ability. Reported increase in pain from 0/10 at start of visit to 4/10 "soreness" across lower back evenly distributed and slightly relieved with standing extension exercise. No abnormality noted in gait. Pt able to engage in light trunk strengthening activities today but stated they felt very challenging and heavy after. During stated they were challenging but not too difficulty. Pt instructed in subtraction of lateral exercises for HEP due to good results not performing them since last visit. Pt continues to report relief from 6x/day repeated lumbar extension in standing (15 reps each time). Pt required cuing for proper technique and to facilitate improved neuromuscular control, strength, range of motion, and functional ability. Patient overall progressing towards goals.    Rehab Potential  Good    Clinical Impairments Affecting Rehab Potential  (-) ambivalance at needing PT; (+) good health  and level of fitness  prior to injury and desire to get back to full function; leg symptoms are intermittant.     PT Frequency  2x / week    PT Duration  6 weeks    PT Treatment/Interventions  ADLs/Self Care Home Management;Cryotherapy;Electrical Stimulation;Moist Heat;Gait training;Stair training;Balance training;Therapeutic exercise;Therapeutic activities;Functional mobility training;Neuromuscular re-education;Patient/family education;Manual techniques;Passive range of motion;Spinal Manipulations;Joint Manipulations;Other (comment)   joint moblizations grades I-V   PT Next Visit Plan  assess response to HEP and adjust/progress treatment according to presentation.     PT Home Exercise Plan  Medbridge Access Code: [email or web address]     Consulted and Agree with Plan of Care  Patient       Patient will benefit from skilled therapeutic intervention in order to improve the following deficits and impairments:  Decreased endurance, Decreased mobility, Difficulty walking, Increased muscle spasms, Decreased range of motion, Impaired perceived functional ability, Improper body mechanics, Obesity, Decreased activity tolerance, Decreased strength, Impaired flexibility, Pain, Postural dysfunction  Visit Diagnosis: Acute right-sided low back pain with right-sided sciatica  Difficulty in walking, not elsewhere classified     Problem List Patient Active Problem List   Diagnosis Date Noted  . 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 Obesity 07/24/2017  . History of breast cancer 07/23/2017  . Hyperlipidemia 07/23/2017  . Thyroid nodule 07/23/2017    Nancy Nordmann, PT, DPT 09/07/2018, 2:34 PM  [facility] [address], [place], [address] Phone: [phone]   Fax:  [phone]  Name: April Stuart MRN: [id number] Date of Birth: 11-Jun-1951

## 2018-09-09 ENCOUNTER — Ambulatory Visit: Payer: Medicare Other | Admitting: Physical Therapy

## 2018-09-14 ENCOUNTER — Ambulatory Visit: Payer: Medicare Other | Admitting: Physical Therapy

## 2018-09-14 ENCOUNTER — Encounter: Payer: Self-pay | Admitting: Physical Therapy

## 2018-09-14 DIAGNOSIS — R262 Difficulty in walking, not elsewhere classified: Secondary | ICD-10-CM

## 2018-09-14 DIAGNOSIS — M5441 Lumbago with sciatica, right side: Secondary | ICD-10-CM

## 2018-09-14 NOTE — Therapy (Addendum)
[facility] [address] [place], [address] Phone: [phone]   Fax:  [phone]  Physical Therapy Treatment  Patient Details  Name: April Stuart MRN: [id number] Date of Birth: 10/13/1951 Referring Provider (PT): Virginia Crews, MD   Encounter Date: 09/14/2018  PT End of Session - 09/14/18 1422    Visit Number  4    Number of Visits  13    Date for PT Re-Evaluation  10/06/18    Authorization Type  Medicare    Authorization Time Period  Current Cert period: 52/77/8242 - 10/06/2018 (last PN: IE 08/25/2018)    Authorization - Visit Number  4    Authorization - Number of Visits  10    PT Start Time  1345    PT Stop Time  1400    PT Time Calculation (min)  15 min    Activity Tolerance  Other (comment)   education only   Behavior During Therapy  WFL for tasks assessed/performed       Past Medical History:  Diagnosis Date  . Breast cancer ([place]) 2008   R breast  . Personal history of chemotherapy 03/2007   right breast ca  . Personal history of radiation therapy 2008   right breast ca  . Thyroid nodule 2017    Past Surgical History:  Procedure Laterality Date  . APPENDECTOMY  1958  . BARTHOLIN GLAND CYST EXCISION  1978  . BREAST BIOPSY Right 03/19/2007   carcinoma  . BREAST BIOPSY Right 08/10/2013   rt breast 3:00 coil clip benign per pt done in [place], 9:00 wing clip benign per pt done in [place] Right ?   ribbon marker benign  . BREAST EXCISIONAL BIOPSY Left 2017   pt states benign lymph nodes removed from axilla  . BREAST LUMPECTOMY Right 08/11/2007   with axillary dissection.   Marland Kitchen [place]   x [place]  . TUBAL LIGATION  1976    There were no vitals filed for this visit.  Subjective Assessment - 09/14/18 1348    Subjective  Patient reports that after her appointment last week she felt okay that day and the following day. But  the third day (Thursday) she could not even get out of bed. She states she was sore on the left side low back buttocks and anterior thigh to knee as a buring pain. This was different than her cheif complaint right sided pain. She could not bend down, go to work, sit, or put weight on the right side. Friday was just as bad and it started to get better Saturday.  She had to lay on her back when it was worst and felt she could not get out of bed. She did not do any exercises during that time. She did nothing but rest and take Advil. She was able to go back to work on Friday.  She would like to take a break from PT at this point due to this apparent strong reaction to physicual therapy and because she is traveling to see family for the Thanksgiving Holiday. She would like to return the first week of December.      Pertinent History  Patient is a 67 y.o. female who presents to outpatient physical therapy with a referral for acute right-sided low back pain without sciatica. This patient's chief complaints consist of pain, stiffness, reduced activity tolerance after falling  off of a treadmill in mid September 2019, leading to the following functional deficits: difficulty with sitting, standing, traveling, walking, completing usual fitness program, sleeping, changing positions and transferring.      Limitations  Sitting;Lifting;Standing;Walking;Other (comment);House hold activities   exercise program   How long can you sit comfortably?  ~1 hour before changing positions. Hard/painful to get up after.     How long can you stand comfortably?  ~ 1 hour (making recipe in kitchen)    How long can you walk comfortably?  ~ not doing much walking. Walking slower (not briskly). 08/18/18: 30 minutes 2.5 mph no incline. Took 2 days to recover.     Diagnostic tests  none    Patient Stated Goals  return to normal routine without pain.     Currently in Pain?  Yes    Pain Score  3     Pain Location  Back    Pain Orientation   Left;Lower    Pain Descriptors / Indicators  Burning    Pain Radiating Towards  Intermittant to left leg when standing for too long.     Pain Onset  More than a month ago    Pain Frequency  Constant        PT Education - 09/14/18 1415    Education Details  advice for trip, explanation of what we can learn from a flair up, expectations going forward, goals for future treatment and POC    Person(s) Educated  Patient    Methods  Explanation    Comprehension  Verbalized understanding      TREATMENT:  Denies sensitivity to latex.   Education only. Pt requested not to do any activities today due to severe pain occurring 2 days after last visit that appears to be related to last visit activities.   HOME EXERCISE PROGRAM Access Code: [email or web address]  URL: [email or web address]  Date: 09/02/2018  Prepared by: Rosita Kea   Exercises   Seated Correct Posture   Standing Lumbar Extension - 10-15 reps - 1 sets - 1 second hold - 4-6x daily   Lumbar Flexion Rotation with Self Overpressure - 10-15 reps - 1 second hold - 4x daily   Pain on Right-Standing Repeated Side Glide - 10-15 reps - 1 second hold - 4x daily    PT Short Term Goals - 09/07/18 1432      PT SHORT TERM GOAL #1   Title  Be independent with home exercise program completed at least 3 times per week for self-management of symptoms.    Baseline  initial HEP provided (08/25/2018); pt reported completing consistently 09/07/2018;     Time  2    Period  Weeks    Status  Achieved    Target Date  09/08/18        PT Long Term Goals - 08/25/18 1822      PT LONG TERM GOAL #1   Title  Improve Modified Oswestry Disability Index score to equal or less than 10% to demonstrate improved self reported function.     Baseline  24% (08/25/2018);    Time  6    Period  Weeks    Status  New    Target Date  10/06/18      PT LONG TERM GOAL #2   Title  Complete community, work and/or recreational activities without  limitation due to current condition.     Baseline  unable to exercise, cook, or go about usual activities at home and at work without  difficulty due to condition (08/25/2018);    Status  New    Target Date  08/31/18      PT LONG TERM GOAL #3   Title  Reduce pain with functional activities to equal or less than 1/10 to allow patient to complete usual activities including ADLs, IADLs, and social engagement with less difficulty.     Baseline  7/10 (08/25/2018);     Time  6    Period  Weeks    Status  New    Target Date  10/06/18      PT LONG TERM GOAL #4   Title  Be independent with a long-term home exercise program for self-management of symptoms.     Baseline  initial HEP given (08/25/2018);     Time  6    Period  Weeks    Status  New    Target Date  10/06/18      PT LONG TERM GOAL #5   Title  Have full lumbar spine AROM with no increase in pain in all planes except intermittent end range discomfort to allow patient to complete valued activities with less difficulty.     Baseline  restricted to 25% in extension and provocation of symptoms in flexion (08/25/2018);    Time  6    Period  Weeks    Status  New    Target Date  10/06/18            Plan - 09/14/18 1416    Clinical Impression Statement  No active interventions were performed today per pt preference. Patient requested at first to discontinue PT but later requested to put it on hold until she feels better and is back from her Thanksgiving vacation. Pt appears to have experienced a delayed flair in symptoms, which has been her pattern from the past but is unexpectedly severe compared to the activities she completed in clinic last visit. The major difference in last treatment compared to previous treatmens was addition of light strengthening exercises with neutral spinal position. She is to let us know if she decides not to continue after her vacation. Patient was educated on how treatment approach would change to a much more  gradual return to strengthening activities with the goal of improving function and allowing her to return to her valued activities. The approach would be more gradual, based on her response to the level of progression attempted at last visit. Patient still wants to improve her back pain and return to walking program and valued activities and wishes to return to PT but not experience this intolerable pain in responseNo visit recorded today as it was advice only and no active treatment.     Rehab Potential  Good    Clinical Impairments Affecting Rehab Potential  (-) ambivalance at needing PT; (+) good health and level of fitness prior to injury and desire to get back to full function; leg symptoms are intermittant.     PT Frequency  2x / week    PT Duration  6 weeks    PT Treatment/Interventions  ADLs/Self Care Home Management;Cryotherapy;Electrical Stimulation;Moist Heat;Gait training;Stair training;Balance training;Therapeutic exercise;Therapeutic activities;Functional mobility training;Neuromuscular re-education;Patient/family education;Manual techniques;Passive range of motion;Spinal Manipulations;Joint Manipulations;Other (comment)    PT Next Visit Plan  progress more gradually as tolerated to restoration of function    PT Home Exercise Plan  Medbridge Access Code: [email or web address]     Consulted and Agree with Plan of Care  Patient       Patient will benefit  from skilled therapeutic intervention in order to improve the following deficits and impairments:  Decreased endurance, Decreased mobility, Difficulty walking, Increased muscle spasms, Decreased range of motion, Impaired perceived functional ability, Improper body mechanics, Obesity, Decreased activity tolerance, Decreased strength, Impaired flexibility, Pain, Postural dysfunction  Visit Diagnosis: Acute right-sided low back pain with right-sided sciatica  Difficulty in walking, not elsewhere classified     Problem List Patient Active Problem  List   Diagnosis Date Noted  . 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 Obesity 07/24/2017  . History of breast cancer 07/23/2017  . Hyperlipidemia 07/23/2017  . Thyroid nodule 07/23/2017    Nancy Nordmann, PT, DPT 09/14/2018, 2:23 PM  [facility] [address], [place], [address] Phone: [phone]   Fax:  [phone]  Name: April Stuart MRN: [id number] Date of Birth: 11-05-1950

## 2018-09-16 ENCOUNTER — Ambulatory Visit: Payer: Medicare Other | Admitting: Physical Therapy

## 2018-09-21 ENCOUNTER — Ambulatory Visit: Payer: Medicare Other | Admitting: Physical Therapy

## 2018-09-30 ENCOUNTER — Ambulatory Visit: Payer: Medicare Other | Admitting: Physical Therapy

## 2018-09-30 ENCOUNTER — Telehealth: Payer: Self-pay | Admitting: Physical Therapy

## 2018-09-30 NOTE — Telephone Encounter (Signed)
Called and spoke to patient after she no-showed to today's appt. She was snowed in in [place] and actually got back today. She states she called on Monday to move her appt and thought it had been moved to Tuesday. She would like to cancel for now.Her back is much worse and the only thing that helps it now is sitting for hours with a heating pad. She will see her doctor tomorrow to determine what to do next and agreed to call us and let us know if she needs further PT or not.

## 2018-10-01 ENCOUNTER — Ambulatory Visit
Admission: RE | Admit: 2018-10-01 | Discharge: 2018-10-01 | Disposition: A | Discharge status: Home / Self Care | Payer: Medicare Other | Source: Ambulatory Visit | Attending: Family Medicine | Admitting: Family Medicine

## 2018-10-01 ENCOUNTER — Ambulatory Visit
Admission: RE | Admit: 2018-10-01 | Discharge: 2018-10-01 | Disposition: A | Discharge status: Home / Self Care | Payer: Medicare Other | Attending: Family Medicine | Admitting: Family Medicine

## 2018-10-01 ENCOUNTER — Ambulatory Visit (INDEPENDENT_AMBULATORY_CARE_PROVIDER_SITE_OTHER): Payer: Medicare Other | Admitting: Family Medicine

## 2018-10-01 ENCOUNTER — Encounter: Payer: Self-pay | Admitting: Family Medicine

## 2018-10-01 VITALS — BP 162/80 | HR 81 | Temp 97.8°F | Wt 166.4 lb

## 2018-10-01 DIAGNOSIS — M545 Low back pain: Secondary | ICD-10-CM | POA: Diagnosis not present

## 2018-10-01 DIAGNOSIS — M5442 Lumbago with sciatica, left side: Principal | ICD-10-CM

## 2018-10-01 DIAGNOSIS — G8929 Other chronic pain: Secondary | ICD-10-CM | POA: Diagnosis not present

## 2018-10-01 MED ORDER — CYCLOBENZAPRINE HCL 5 MG PO TABS: 5.0000 mg | ORAL_TABLET | Freq: Three times a day (TID) | ORAL | 1 refills | Status: AC | PRN

## 2018-10-01 MED ORDER — PREDNISONE 10 MG PO TABS: ORAL_TABLET | ORAL | 0 refills | Status: DC

## 2018-10-01 NOTE — Patient Instructions (Signed)
Sciatica Sciatica is pain, numbness, weakness, or tingling along the path of the sciatic nerve. The sciatic nerve starts in the lower back and runs down the back of each leg. The nerve controls the muscles in the lower leg and in the back of the knee. It also provides feeling (sensation) to the back of the thigh, the lower leg, and the sole of the foot. Sciatica is a symptom of another medical condition that pinches or puts pressure on the sciatic nerve. Generally, sciatica only affects one side of the body. Sciatica usually goes away on its own or with treatment. In some cases, sciatica may keep coming back (recur). What are the causes? This condition is caused by pressure on the sciatic nerve, or pinching of the sciatic nerve. This may be the result of:  A disk in between the bones of the spine (vertebrae) bulging out too far (herniated disk).  Age-related changes in the spinal disks (degenerative disk disease).  A pain disorder that affects a muscle in the buttock (piriformis syndrome).  Extra bone growth (bone spur) near the sciatic nerve.  An injury or break (fracture) of the pelvis.  Pregnancy.  Tumor (rare).  What increases the risk? The following factors may make you more likely to develop this condition:  Playing sports that place pressure or stress on the spine, such as football or weight lifting.  Having poor strength and flexibility.  A history of back injury.  A history of back surgery.  Sitting for long periods of time.  Doing activities that involve repetitive bending or lifting.  Obesity.  What are the signs or symptoms? Symptoms can vary from mild to very severe, and they may include:  Any of these problems in the lower back, leg, hip, or buttock: ? Mild tingling or dull aches. ? Burning sensations. ? Sharp pains.  Numbness in the back of the calf or the sole of the foot.  Leg weakness.  Severe back pain that makes movement difficult.  These  symptoms may get worse when you cough, sneeze, or laugh, or when you sit or stand for long periods of time. Being overweight may also make symptoms worse. In some cases, symptoms may recur over time. How is this diagnosed? This condition may be diagnosed based on:  Your symptoms.  A physical exam. Your health care provider may ask you to do certain movements to check whether those movements trigger your symptoms.  You may have tests, including: ? Blood tests. ? X-rays. ? MRI. ? CT scan.  How is this treated? In many cases, this condition improves on its own, without any treatment. However, treatment may include:  Reducing or modifying physical activity during periods of pain.  Exercising and stretching to strengthen your abdomen and improve the flexibility of your spine.  Icing and applying heat to the affected area.  Medicines that help: ? To relieve pain and swelling. ? To relax your muscles.  Injections of medicines that help to relieve pain, irritation, and inflammation around the sciatic nerve (steroids).  Surgery.  Follow these instructions at home: Medicines  Take over-the-counter and prescription medicines only as told by your health care provider.  Do not drive or operate heavy machinery while taking prescription pain medicine. Managing pain  If directed, apply ice to the affected area. ? Put ice in a plastic bag. ? Place a towel between your skin and the bag. ? Leave the ice on for 20 minutes, 2-3 times a day.  After icing, apply   heat to the affected area before you exercise or as often as told by your health care provider. Use the heat source that your health care provider recommends, such as a moist heat pack or a heating pad. ? Place a towel between your skin and the heat source. ? Leave the heat on for 20-30 minutes. ? Remove the heat if your skin turns bright red. This is especially important if you are unable to feel pain, heat, or cold. You may have a  greater risk of getting burned. Activity  Return to your normal activities as told by your health care provider. Ask your health care provider what activities are safe for you. ? Avoid activities that make your symptoms worse.  Take brief periods of rest throughout the day. Resting in a lying or standing position is usually better than sitting to rest. ? When you rest for longer periods, mix in some mild activity or stretching between periods of rest. This will help to prevent stiffness and pain. ? Avoid sitting for long periods of time without moving. Get up and move around at least one time each hour.  Exercise and stretch regularly, as told by your health care provider.  Do not lift anything that is heavier than 10 lb (4.5 kg) while you have symptoms of sciatica. When you do not have symptoms, you should still avoid heavy lifting, especially repetitive heavy lifting.  When you lift objects, always use proper lifting technique, which includes: ? Bending your knees. ? Keeping the load close to your body. ? Avoiding twisting. General instructions  Use good posture. ? Avoid leaning forward while sitting. ? Avoid hunching over while standing.  Maintain a healthy weight. Excess weight puts extra stress on your back and makes it difficult to maintain good posture.  Wear supportive, comfortable shoes. Avoid wearing high heels.  Avoid sleeping on a mattress that is too soft or too hard. A mattress that is firm enough to support your back when you sleep may help to reduce your pain.  Keep all follow-up visits as told by your health care provider. This is important. Contact a health care provider if:  You have pain that wakes you up when you are sleeping.  You have pain that gets worse when you lie down.  Your pain is worse than you have experienced in the past.  Your pain lasts longer than 4 weeks.  You experience unexplained weight loss. Get help right away if:  You lose control  of your bowel or bladder (incontinence).  You have: ? Weakness in your lower back, pelvis, buttocks, or legs that gets worse. ? Redness or swelling of your back. ? A burning sensation when you urinate. This information is not intended to replace advice given to you by your health care provider. Make sure you discuss any questions you have with your health care provider. Document Released: 10/07/2001 Document Revised: 03/18/2016 Document Reviewed: 06/22/2015 Elsevier Interactive Patient Education  2018 Elsevier Inc.  

## 2018-10-01 NOTE — Progress Notes (Signed)
Patient: April Stuart Female    DOB: 1950/12/09   67 y.o.   MRN: [id number] Visit Date: 10/01/2018  Today's Provider: Lavon Paganini, MD   Chief Complaint  Patient presents with  . Back Pain   Subjective:    HPI  Back Pain Patient presents today for right low back pain down to left leg. Patient was seen at the outpatient rehab on 09/14/2018 for physical therapy and states she stopped going due to not getting any better. Patient would like to having x-ray done to rule out what is the problem. Patient have taking Aleve, Motrin, Bayer back and muscle relaxer.  Pain is in R low back with radiation across to L low back and down into L thigh intermittently.  Denies weakness, numbness, bowel/bladder incontinence/retention, perineal anesthesia.  Pain is better with heating pad and worse with sitting or standing for long periods of time.  States this is impeding her life as she hasnt been to church in 1 month     Allergies  Allergen Reactions  . Penicillins Shortness Of Breath and Swelling    Facial swelling  . Sulfa Antibiotics Swelling    Facial swelling  . Crestor [Rosuvastatin Calcium]     Myalgias   . Lipitor [Atorvastatin]     myalgias     Current Outpatient Medications:  .  Biotin 5000 MCG CAPS, Take 5,000 mcg by mouth daily., Disp: , Rfl:  .  cholecalciferol (VITAMIN D) 1000 units tablet, Take 2,000 Units by mouth daily. , Disp: , Rfl:  .  Multiple Vitamins-Minerals (CENTRUM ADULTS PO), Take by mouth., Disp: , Rfl:  .  NAPROXEN PO, Take by mouth., Disp: , Rfl:   Review of Systems  Constitutional: Negative.   HENT: Negative.   Gastrointestinal: Negative.   Genitourinary: Negative.   Musculoskeletal: Positive for back pain.    Social History   Tobacco Use  . Smoking status: Former Smoker    Packs/day: 2.00    Years: 38.00    Pack years: 76.00    Types: Cigarettes    Last attempt to quit: 10/26/1997    Years since quitting: 20.9  . Smokeless tobacco:  Never Used  Substance Use Topics  . Alcohol use: Yes    Alcohol/week: 7.0 standard drinks    Types: 7 Glasses of wine per week   Objective:   BP (!) 162/80 (BP Location: Left Arm, Patient Position: Sitting, Cuff Size: Normal)   Pulse 81   Temp 97.8 F (36.6 C) (Oral)   Wt 166 lb 6.4 oz (75.5 kg)   SpO2 96%   BMI 32.50 kg/m  Vitals:   10/01/18 1320  BP: (!) 162/80  Pulse: 81  Temp: 97.8 F (36.6 C)  TempSrc: Oral  SpO2: 96%  Weight: 166 lb 6.4 oz (75.5 kg)     Physical Exam  Constitutional: She is oriented to person, place, and time. She appears well-developed and well-nourished. No distress.  HENT:  Head: Normocephalic and atraumatic.  Eyes: Conjunctivae are normal. No scleral icterus.  Neck: Neck supple. No thyromegaly present.  Cardiovascular: Normal rate, regular rhythm, normal heart sounds and intact distal pulses.  No murmur heard. Pulmonary/Chest: Effort normal and breath sounds normal. No respiratory distress. She has no wheezes. She has no rales.  Musculoskeletal: She exhibits no edema.  Back: No midline TTP over spinous processes. Mild TTP over R and L paraspinal musculature.  LE strength and sensation to light touch grossly intact. Negative SLR b/l  Lymphadenopathy:  She has no cervical adenopathy.  Neurological: She is alert and oriented to person, place, and time.  Skin: Skin is warm and dry. Capillary refill takes less than 2 seconds. No rash noted.  Psychiatric: She has a normal mood and affect. Her behavior is normal.  Vitals reviewed.      Assessment & Plan:   1. Chronic bilateral low back pain with left-sided sciatica - now chronic  - no improvement with PT - will try prednisone burst and flexeril prn - lumbar XRay to assess for DJD or other pathology - return precautions discussed - DG Lumbar Spine Complete; Future    Meds ordered this encounter  Medications  . predniSONE (DELTASONE) 10 MG tablet    Sig: Take 60mg  PO qd x2d, then 50mg   qd x2d, then 40mg  daily x2d, then 30mg  daily x2d, then 20mg  daily x2d, then 10mg  daily x2d, then stop    Dispense:  42 tablet    Refill:  0  . cyclobenzaprine (FLEXERIL) 5 MG tablet    Sig: Take 1 tablet (5 mg total) by mouth 3 (three) times daily as needed for muscle spasms.    Dispense:  30 tablet    Refill:  1     Return if symptoms worsen or fail to improve.   The entirety of the information documented in the History of Present Illness, Review of Systems and Physical Exam were personally obtained by me. Portions of this information were initially documented by Tiburcio Pea and Hurman Horn, CMA and reviewed by me for thoroughness and accuracy.    Virginia Crews, MD, MPH [facility] 10/05/2018 10:10 AM

## 2018-10-04 ENCOUNTER — Telehealth: Payer: Self-pay | Admitting: Family Medicine

## 2018-10-04 ENCOUNTER — Telehealth: Payer: Self-pay | Admitting: Physical Therapy

## 2018-10-04 ENCOUNTER — Encounter: Payer: Self-pay | Admitting: Physical Therapy

## 2018-10-04 ENCOUNTER — Telehealth: Payer: Self-pay

## 2018-10-04 DIAGNOSIS — M5441 Lumbago with sciatica, right side: Secondary | ICD-10-CM

## 2018-10-04 DIAGNOSIS — R262 Difficulty in walking, not elsewhere classified: Secondary | ICD-10-CM

## 2018-10-04 DIAGNOSIS — S32040A Wedge compression fracture of fourth lumbar vertebra, initial encounter for closed fracture: Secondary | ICD-10-CM

## 2018-10-04 NOTE — Telephone Encounter (Signed)
Patient advised as directed below. She does agree to the referral.

## 2018-10-04 NOTE — Telephone Encounter (Signed)
April Stuart with [facility] [phone]  Needing to give CMA a lumbar spine result.  Please advise.  Thanks, [employer]

## 2018-10-04 NOTE — Telephone Encounter (Signed)
Referral placed.

## 2018-10-04 NOTE — Telephone Encounter (Signed)
Called pt back after receiving message from front office staff to call her.  Pt reported she went to her physician on Friday and a radiograph showed a compression fracture in her spine. She wanted to let me know of the result of her visit. She state she has been referred to a specialist and is waiting for that appointment before she knows if she will return to PT. I thanked her for the update and let her know she would need a new referral prior to returning to PT. She wanted to make sure she gets to see me in the future, and I advised her to request that when calling to schedule. Will now discharge pt's current episode of care/case.

## 2018-10-04 NOTE — Telephone Encounter (Signed)
-----   Message from April Crews, MD sent at 10/04/2018 10:28 AM EST ----- Patient has a compression fracture.  Unclear when this happened.  This and there is some arthritis that is likely causing pain.  Recommend referral to an [facility] does procedures for these when necessary.  Would like her to see Dr Rudene Christians.  Ok to place referral if she agrees

## 2018-10-04 NOTE — Telephone Encounter (Signed)
[email or web address]

## 2018-10-04 NOTE — Therapy (Signed)
[facility] [address], [place], [address] Phone: [phone]   Fax:  [phone]  Physical Therapy Discharge Summary Reporting period:  08/25/2018 - 10/04/2018  Patient Details  Name: April Stuart MRN: [id number] Date of Birth: 13-Aug-1951 Referring Provider (PT): Virginia Crews, MD   Encounter Date: 10/04/2018    Past Medical History:  Diagnosis Date  . Breast cancer ([place]) 2008   R breast  . Personal history of chemotherapy 03/2007   right breast ca  . Personal history of radiation therapy 2008   right breast ca  . Thyroid nodule 2017    Past Surgical History:  Procedure Laterality Date  . APPENDECTOMY  1958  . BARTHOLIN GLAND CYST EXCISION  1978  . BREAST BIOPSY Right 03/19/2007   carcinoma  . BREAST BIOPSY Right 08/10/2013   rt breast 3:00 coil clip benign per pt done in [place], 9:00 wing clip benign per pt done in [place] Right ?   ribbon marker benign  . BREAST EXCISIONAL BIOPSY Left 2017   pt states benign lymph nodes removed from axilla  . BREAST LUMPECTOMY Right 08/11/2007   with axillary dissection.   [facility] [place]   x [place]  . TUBAL LIGATION  1976    There were no vitals filed for this visit.  Subjective Assessment - 10/04/18 1534    Subjective  Patient called today and reported that she saw her doctor who performed a radiograph of her lumbar spine and diagnosed a compression fracture. She states she has been referred to another specialist. She is unsure if she will be referred back to PT but at this point needs to be discharged from this episode of care until she gets further advice.     Pertinent History  Patient is a 67 y.o. female who presents to outpatient physical therapy with a referral for acute right-sided low back pain without sciatica. This patient's chief complaints consist of pain, stiffness, reduced activity  tolerance after falling off of a treadmill in mid September 2019, leading to the following functional deficits: difficulty with sitting, standing, traveling, walking, completing usual fitness program, sleeping, changing positions and transferring.      Limitations  Sitting;Lifting;Standing;Walking;Other (comment);House hold activities   exercise program   How long can you sit comfortably?  ~1 hour before changing positions. Hard/painful to get up after.     How long can you stand comfortably?  ~ 1 hour (making recipe in kitchen)    How long can you walk comfortably?  ~ not doing much walking. Walking slower (not briskly). 08/18/18: 30 minutes 2.5 mph no incline. Took 2 days to recover.     Diagnostic tests  none    Patient Stated Goals  return to normal routine without pain.     Pain Onset  More than a month ago         OBJECTIVE:  Patient not present for exam. Please see previous notes for latest objective data.      PT Short Term Goals - 09/07/18 1432      PT SHORT TERM GOAL #1   Title  Be independent with home exercise program completed at least 3 times per week for self-management of symptoms.    Baseline  initial HEP provided (08/25/2018); pt reported completing consistently 09/07/2018;     Time  2    Period  Weeks  Status  Achieved    Target Date  09/08/18        PT Long Term Goals - 10/04/18 1550      PT LONG TERM GOAL #1   Title  Improve Modified Oswestry Disability Index score to equal or less than 10% to demonstrate improved self reported function.     Baseline  24% (08/25/2018);    Time  6    Period  Weeks    Status  Unable to assess    Target Date  10/06/18      PT LONG TERM GOAL #2   Title  Complete community, work and/or recreational activities without limitation due to current condition.     Baseline  unable to exercise, cook, or go about usual activities at home and at work without difficulty due to condition (08/25/2018);    Status  Not Met    Target  Date  08/31/18      PT LONG TERM GOAL #3   Title  Reduce pain with functional activities to equal or less than 1/10 to allow patient to complete usual activities including ADLs, IADLs, and social engagement with less difficulty.     Baseline  7/10 (08/25/2018);     Time  6    Period  Weeks    Status  Not Met    Target Date  10/06/18      PT LONG TERM GOAL #4   Title  Be independent with a long-term home exercise program for self-management of symptoms.     Baseline  initial HEP given (08/25/2018);     Time  6    Period  Weeks    Status  Not Met    Target Date  10/06/18      PT LONG TERM GOAL #5   Title  Have full lumbar spine AROM with no increase in pain in all planes except intermittent end range discomfort to allow patient to complete valued activities with less difficulty.     Baseline  restricted to 25% in extension and provocation of symptoms in flexion (08/25/2018);    Time  6    Period  Weeks    Status  Not Met    Target Date  10/06/18             Plan - 10/04/18 1601    Clinical Impression Statement  Patient attended 5 physical therapy treatment sessions before returning to medical provider for further evaluation where a compression fracture was diagnosed by radiograph. She made good progress at first but then started having increasing pain and was unable to reach her goals. She is now being discharged from physical therapy due to new diagnosis and need for further medical evaluation by a specialist who she has been referred to but has yet to see. Patient's care will be resumed should she return and require further skilled physical therapy.    Rehab Potential  Good    Clinical Impairments Affecting Rehab Potential  (-) ambivalance at needing PT; (+) good health and level of fitness prior to injury and desire to get back to full function; leg symptoms are intermittant.     PT Frequency  2x / week    PT Duration  6 weeks    PT Treatment/Interventions  ADLs/Self Care Home  Management;Cryotherapy;Electrical Stimulation;Moist Heat;Gait training;Stair training;Balance training;Therapeutic exercise;Therapeutic activities;Functional mobility training;Neuromuscular re-education;Patient/family education;Manual techniques;Passive range of motion;Spinal Manipulations;Joint Manipulations;Other (comment)    PT Next Visit Plan  Patient is now discharged from skilled physical therapy.  PT Home Exercise Plan  Medbridge Access Code: [email or web address]     Consulted and Agree with Plan of Care  Patient       Patient will benefit from skilled therapeutic intervention in order to improve the following deficits and impairments:  Decreased endurance, Decreased mobility, Difficulty walking, Increased muscle spasms, Decreased range of motion, Impaired perceived functional ability, Improper body mechanics, Obesity, Decreased activity tolerance, Decreased strength, Impaired flexibility, Pain, Postural dysfunction  Visit Diagnosis: Acute right-sided low back pain with right-sided sciatica  Difficulty in walking, not elsewhere classified     Problem List Patient Active Problem List   Diagnosis Date Noted  . 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 Obesity 07/24/2017  . History of breast cancer 07/23/2017  . Hyperlipidemia 07/23/2017  . Thyroid nodule 07/23/2017    Nancy Nordmann, PT, DPT 10/04/2018, 4:01 PM  [facility] [address] [place], [address] Phone: [phone]   Fax:  [phone]  Name: April Stuart MRN: [id number] Date of Birth: 1951-03-14

## 2018-10-07 ENCOUNTER — Telehealth: Payer: Self-pay | Admitting: Family Medicine

## 2018-10-07 DIAGNOSIS — S32040A Wedge compression fracture of fourth lumbar vertebra, initial encounter for closed fracture: Secondary | ICD-10-CM | POA: Diagnosis not present

## 2018-10-07 DIAGNOSIS — M533 Sacrococcygeal disorders, not elsewhere classified: Secondary | ICD-10-CM | POA: Diagnosis not present

## 2018-10-07 DIAGNOSIS — W01198A Fall on same level from slipping, tripping and stumbling with subsequent striking against other object, initial encounter: Secondary | ICD-10-CM | POA: Diagnosis not present

## 2018-10-07 DIAGNOSIS — M5416 Radiculopathy, lumbar region: Secondary | ICD-10-CM | POA: Diagnosis not present

## 2018-10-07 NOTE — Telephone Encounter (Signed)
Alice w/ Dr. Rudene Christians office [phone]  Calling regarding pt will need an MRI referral ordered before anything can be done.  Please advise.  Thanks, [employer]

## 2018-10-07 NOTE — Telephone Encounter (Signed)
Pt is going to se PA at Dr. Rudene Christians office so no MRI is required.  States Dr. Rudene Christians will order.

## 2018-10-08 ENCOUNTER — Other Ambulatory Visit: Payer: Self-pay | Admitting: Orthopedic Surgery

## 2018-10-08 DIAGNOSIS — M533 Sacrococcygeal disorders, not elsewhere classified: Secondary | ICD-10-CM

## 2018-10-08 DIAGNOSIS — M5416 Radiculopathy, lumbar region: Secondary | ICD-10-CM

## 2018-10-08 DIAGNOSIS — S32040A Wedge compression fracture of fourth lumbar vertebra, initial encounter for closed fracture: Secondary | ICD-10-CM

## 2018-10-29 ENCOUNTER — Ambulatory Visit
Admission: RE | Admit: 2018-10-29 | Discharge: 2018-10-29 | Disposition: A | Discharge status: Home / Self Care | Payer: Medicare Other | Source: Ambulatory Visit | Attending: Orthopedic Surgery | Admitting: Orthopedic Surgery

## 2018-10-29 DIAGNOSIS — S32040A Wedge compression fracture of fourth lumbar vertebra, initial encounter for closed fracture: Secondary | ICD-10-CM | POA: Diagnosis not present

## 2018-10-29 DIAGNOSIS — M5416 Radiculopathy, lumbar region: Secondary | ICD-10-CM | POA: Insufficient documentation

## 2018-10-29 DIAGNOSIS — M533 Sacrococcygeal disorders, not elsewhere classified: Secondary | ICD-10-CM | POA: Diagnosis not present

## 2018-10-29 DIAGNOSIS — M8448XA Pathological fracture, other site, initial encounter for fracture: Secondary | ICD-10-CM | POA: Diagnosis not present

## 2018-11-01 ENCOUNTER — Other Ambulatory Visit: Payer: Self-pay

## 2018-11-01 ENCOUNTER — Inpatient Hospital Stay: Payer: Medicare Other | Attending: Oncology | Admitting: Oncology

## 2018-11-01 ENCOUNTER — Encounter: Payer: Self-pay | Admitting: Oncology

## 2018-11-01 ENCOUNTER — Inpatient Hospital Stay: Payer: Medicare Other

## 2018-11-01 ENCOUNTER — Other Ambulatory Visit: Payer: Self-pay | Admitting: *Deleted

## 2018-11-01 VITALS — BP 149/84 | HR 98 | Temp 96.9°F | Resp 18 | Ht 60.04 in | Wt 169.5 lb

## 2018-11-01 DIAGNOSIS — Z853 Personal history of malignant neoplasm of breast: Secondary | ICD-10-CM

## 2018-11-01 DIAGNOSIS — C7951 Secondary malignant neoplasm of bone: Secondary | ICD-10-CM | POA: Insufficient documentation

## 2018-11-01 DIAGNOSIS — Z87891 Personal history of nicotine dependence: Secondary | ICD-10-CM | POA: Diagnosis not present

## 2018-11-01 DIAGNOSIS — C50919 Malignant neoplasm of unspecified site of unspecified female breast: Secondary | ICD-10-CM | POA: Diagnosis not present

## 2018-11-01 DIAGNOSIS — Z9221 Personal history of antineoplastic chemotherapy: Secondary | ICD-10-CM | POA: Insufficient documentation

## 2018-11-01 DIAGNOSIS — Z923 Personal history of irradiation: Secondary | ICD-10-CM

## 2018-11-01 DIAGNOSIS — Z79811 Long term (current) use of aromatase inhibitors: Secondary | ICD-10-CM | POA: Diagnosis not present

## 2018-11-01 DIAGNOSIS — S34109A Unspecified injury to unspecified level of lumbar spinal cord, initial encounter: Secondary | ICD-10-CM

## 2018-11-01 DIAGNOSIS — M8448XA Pathological fracture, other site, initial encounter for fracture: Secondary | ICD-10-CM | POA: Diagnosis not present

## 2018-11-01 DIAGNOSIS — S32008A Other fracture of unspecified lumbar vertebra, initial encounter for closed fracture: Secondary | ICD-10-CM

## 2018-11-01 DIAGNOSIS — S32040A Wedge compression fracture of fourth lumbar vertebra, initial encounter for closed fracture: Secondary | ICD-10-CM

## 2018-11-01 DIAGNOSIS — M4856XA Collapsed vertebra, not elsewhere classified, lumbar region, initial encounter for fracture: Secondary | ICD-10-CM | POA: Diagnosis not present

## 2018-11-01 LAB — COMPREHENSIVE METABOLIC PANEL
ALBUMIN: 4.3 g/dL (ref 3.5–5.0)
ALT: 59 U/L — ABNORMAL HIGH (ref 0–44)
ANION GAP: 12 (ref 5–15)
AST: 41 U/L (ref 15–41)
Alkaline Phosphatase: 101 U/L (ref 38–126)
BUN: 16 mg/dL (ref 8–23)
CHLORIDE: 103 mmol/L (ref 98–111)
CO2: 24 mmol/L (ref 22–32)
Calcium: 9.2 mg/dL (ref 8.9–10.3)
Creatinine, Ser: 0.58 mg/dL (ref 0.44–1.00)
GFR calc Af Amer: >60 mL/min (ref >60)
GFR calc non Af Amer: >60 mL/min (ref >60)
GLUCOSE: 110 mg/dL — AB (ref 70–99)
POTASSIUM: 3.6 mmol/L (ref 3.5–5.1)
SODIUM: 139 mmol/L (ref 135–145)
Total Bilirubin: 0.6 mg/dL (ref 0.3–1.2)
Total Protein: 7.4 g/dL (ref 6.5–8.1)

## 2018-11-01 LAB — CBC WITH DIFFERENTIAL/PLATELET
ABS IMMATURE GRANULOCYTES: 0.02 10*3/uL (ref 0.00–0.07)
Basophils Absolute: 0 10*3/uL (ref 0.0–0.1)
Basophils Relative: 1 %
Eosinophils Absolute: 0.2 10*3/uL (ref 0.0–0.5)
Eosinophils Relative: 3 %
HCT: 45 % (ref 36.0–46.0)
Hemoglobin: 14.7 g/dL (ref 12.0–15.0)
IMMATURE GRANULOCYTES: 0 %
LYMPHS ABS: 2.2 10*3/uL (ref 0.7–4.0)
Lymphocytes Relative: 33 %
MCH: 29.4 pg (ref 26.0–34.0)
MCHC: 32.7 g/dL (ref 30.0–36.0)
MCV: 90 fL (ref 80.0–100.0)
MONOS PCT: 9 %
Monocytes Absolute: 0.6 10*3/uL (ref 0.1–1.0)
NEUTROS PCT: 54 %
Neutro Abs: 3.7 10*3/uL (ref 1.7–7.7)
PLATELETS: 201 10*3/uL (ref 150–400)
RBC: 5 MIL/uL (ref 3.87–5.11)
RDW: 13 % (ref 11.5–15.5)
WBC: 6.6 10*3/uL (ref 4.0–10.5)
nRBC: 0 % (ref 0.0–0.2)

## 2018-11-01 NOTE — Progress Notes (Signed)
Here for new pt evaluation. Per pt 5 mo ago she fell off tread mill and went to PCP-sent to PT -made it worse -returned to PCP and x rays then MRI showed a " it looks like a tumor on your spine" per pcp.

## 2018-11-01 NOTE — Progress Notes (Signed)
Hematology/Oncology Consult note [facility] Telephone:([phone] [phone]  Patient Care Team: Virginia Crews, MD as PCP - General (Family Medicine) Beverly Gust, MD (Otolaryngology)   Name of the patient: April Stuart  [id number]  [birth year]    Reason for referral-pathologic L4 compression fracture   Referring physician- Dr. Rudene Christians  Date of visit: 11/01/18   History of presenting illness-patient is a 68 year old female with a history of breast cancer back in 2008.  She is status post neoadjuvant chemotherapy lumpectomy adjuvant radiation and 5 years of hormone therapy.  I do not have the details of her breast cancer at this time since all her care was at [place] but patient has her medical records at home.  She otherwise has no significant comorbidities.  She reports falling off her treadmill about 4 to 5 months ago and since then she has been having ongoing back pain which particularly got worse around Thanksgiving.  She reports that her pain is relatively well controlled with ibuprofen when she is sitting in 1 place or laying down but she has uncontrolled pain at times when she ambulates.  Also notes bilateral leg spasms.  Denies any weakness or numbness in her extremities.  Denies any bowel or bladder incontinence.  Patient underwent lumbar spine x-ray in December 2019 which showed L4 compression fracture and was referred to Dr. Rudene Christians for further evaluation.  She underwent MRI of her lumbar spine without contrast on 10/29/2017 which showed an L4 vertebral body fracture with 70% maximal height loss centrally.  Complete replacement of normal bone marrow signal throughout the L4 vertebral body extending into the pedicles and facets bilaterally.  The right L4 pedicle appears expanded with small volume extraosseous tumor not excluded in this region.  There is bulging of vertebral body cortex diffusely including posteriorly into the spinal canal with small  volume epidural tumor not excluded.  2 hypointense lesions located in L3 as well as 2.5 cm lesion in the posterior right ilium.  Findings may reflect metastatic disease or multiple myeloma  Patient has been referred to [place] for further management.  She is currently taking 1-2 doses of ibuprofen a day.  She was also given a short course of steroids about 3 weeks ago and did not think that it helped.  She is hesitant to try any narcotics at this time  ECOG PS- 1  Pain scale- 6   Review of systems- Review of Systems  Constitutional: Negative for chills, fever, malaise/fatigue and weight loss.  HENT: Negative for congestion, ear discharge and nosebleeds.   Eyes: Negative for blurred vision.  Respiratory: Negative for cough, hemoptysis, sputum production, shortness of breath and wheezing.   Cardiovascular: Negative for chest pain, palpitations, orthopnea and claudication.  Gastrointestinal: Negative for abdominal pain, blood in stool, constipation, diarrhea, heartburn, melena, nausea and vomiting.  Genitourinary: Negative for dysuria, flank pain, frequency, hematuria and urgency.  Musculoskeletal: Positive for back pain. Negative for joint pain and myalgias.  Skin: Negative for rash.  Neurological: Negative for dizziness, tingling, focal weakness, seizures, weakness and headaches.  Endo/Heme/Allergies: Does not bruise/bleed easily.  Psychiatric/Behavioral: Negative for depression and suicidal ideas. The patient does not have insomnia.     Allergies  Allergen Reactions  . Penicillins Shortness Of Breath and Swelling    Facial swelling  . Sulfa Antibiotics Swelling    Facial swelling  . Atorvastatin Other (See Comments)    myalgias  . Rosuvastatin Calcium Other (See Comments)    Myalgias  Patient Active Problem List   Diagnosis Date Noted  . 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 Obesity 07/24/2017  . History of breast cancer 07/23/2017  . Hyperlipidemia 07/23/2017  . Thyroid  nodule 07/23/2017     Past Medical History:  Diagnosis Date  . Breast cancer ([place]) 2008   R breast  . Personal history of chemotherapy 03/2007   right breast ca  . Personal history of radiation therapy 2008   right breast ca  . Thyroid nodule 2017     Past Surgical History:  Procedure Laterality Date  . APPENDECTOMY  1958  . BARTHOLIN GLAND CYST EXCISION  1978  . BREAST BIOPSY Right 03/19/2007   carcinoma  . BREAST BIOPSY Right 08/10/2013   rt breast 3:00 coil clip benign per pt done in [place], 9:00 wing clip benign per pt done in [place] Right ?   ribbon marker benign  . BREAST EXCISIONAL BIOPSY Left 2017   pt states benign lymph nodes removed from axilla  . BREAST LUMPECTOMY Right 08/11/2007   with axillary dissection.   Marland Kitchen [place]   x [place]  . TUBAL LIGATION  1976    Social History   Socioeconomic History  . Marital status: Divorced    Spouse name: Not on file  . Number of children: 3  . Years of education: Not on file  . Highest education level: Master's degree (e.g., MA, MS, MEng, MEd, MSW, MBA)  Occupational History  . 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 Financial resource strain: Not hard at all  . Food insecurity:    Worry: Never true    Inability: Never true  . Transportation needs:    Medical: No    Non-medical: No  Tobacco Use  . Smoking status: Former Smoker    Packs/day: 2.00    Years: 38.00    Pack years: 76.00    Types: Cigarettes    Last attempt to quit: 10/26/1997    Years since quitting: 21.0  . Smokeless tobacco: Never Used  Substance and Sexual Activity  . Alcohol use: Yes    Alcohol/week: 7.0 standard drinks    Types: 7 Glasses of wine per week  . Drug use: No  . Sexual activity: Never    Birth control/protection: Surgical  Lifestyle  . Physical activity:    Days per week: Not on file    Minutes per session:  Not on file  . Stress: Not at all  Relationships  . Social connections:    Talks on phone: Not on file    Gets together: Not on file    Attends religious service: Not on file    Active member of club or organization: Not on file    Attends meetings of clubs or organizations: Not on file    Relationship status: Not on file  . Intimate partner violence:    Fear of current or ex partner: Not on file    Emotionally abused: Not on file    Physically abused: Not on file    Forced sexual activity: Not on file  Other Topics Concern  . Not on file  Social History Narrative  . Not on file     Family History  Problem Relation Age of Onset  . Dementia Mother   . Stroke Mother 80  . Pancreatic cancer Father   . Prostate  cancer Father   . HIV Brother   . Breast cancer Neg Hx   . Colon cancer Neg Hx      Current Outpatient Medications:  .  Biotin 5000 MCG CAPS, Take 5,000 mcg by mouth daily., Disp: , Rfl:  .  cholecalciferol (VITAMIN D) 1000 units tablet, Take 2,000 Units by mouth daily. , Disp: , Rfl:  .  cyclobenzaprine (FLEXERIL) 5 MG tablet, Take 1 tablet (5 mg total) by mouth 3 (three) times daily as needed for muscle spasms. (Patient taking differently: Take 5 mg by mouth 3 (three) times daily as needed for muscle spasms. ), Disp: 30 tablet, Rfl: 1 .  Multiple Vitamins-Minerals (CENTRUM ADULTS PO), Take by mouth., Disp: , Rfl:    Physical exam:  Vitals:   11/01/18 1306  BP: (!) 149/84  Pulse: 98  Resp: 18  Temp: (!) 96.9 F (36.1 C)  TempSrc: Tympanic  Weight: 169 lb 8 oz (76.9 kg)  Height: 5' 0.04" (1.525 m)   Physical Exam HENT:     Head: Normocephalic and atraumatic.  Eyes:     Pupils: Pupils are equal, round, and reactive to light.  Neck:     Musculoskeletal: Normal range of motion.  Cardiovascular:     Rate and Rhythm: Normal rate and regular rhythm.     Heart sounds: Normal heart sounds.  Pulmonary:     Effort: Pulmonary effort is normal.     Breath  sounds: Normal breath sounds.  Abdominal:     General: Bowel sounds are normal.     Palpations: Abdomen is soft.  Skin:    General: Skin is warm and dry.  Neurological:     General: No focal deficit present.     Mental Status: She is alert and oriented to person, place, and time.     Cranial Nerves: No cranial nerve deficit.     Sensory: No sensory deficit.     Motor: No weakness.     Coordination: Coordination normal.        CMP Latest Ref Rng & Units 11/01/2018  Glucose 70 - 99 mg/dL 110(H)  BUN 8 - 23 mg/dL 16  Creatinine 0.44 - 1.00 mg/dL 0.58  Sodium 135 - 145 mmol/L 139  Potassium 3.5 - 5.1 mmol/L 3.6  Chloride 98 - 111 mmol/L 103  CO2 22 - 32 mmol/L 24  Calcium 8.9 - 10.3 mg/dL 9.2  Total Protein 6.5 - 8.1 g/dL 7.4  Total Bilirubin 0.3 - 1.2 mg/dL 0.6  Alkaline Phos 38 - 126 U/L 101  AST 15 - 41 U/L 41  ALT 0 - 44 U/L 59(H)   CBC Latest Ref Rng & Units 11/01/2018  WBC 4.0 - 10.5 K/uL 6.6  Hemoglobin 12.0 - 15.0 g/dL 14.7  Hematocrit 36.0 - 46.0 % 45.0  Platelets 150 - 400 K/uL 201    No images are attached to the encounter.  Mr Lumbar Spine Wo Contrast  Result Date: 10/29/2018 CLINICAL DATA:  Right buttock and leg pain with numbness and tingling. [place] off of a treadmill landing on the right buttock 4-5 months ago. L4 compression fracture. History of breast cancer. EXAM: MRI LUMBAR SPINE WITHOUT CONTRAST TECHNIQUE: Multiplanar, multisequence MR imaging of the lumbar spine was performed. No intravenous contrast was administered. COMPARISON:  Lumbar spine radiographs 10/01/2018 FINDINGS: Segmentation:  Standard. Alignment:  Normal. Vertebrae: There is an L4 vertebral body fracture with 70% maximal height loss centrally. There is complete replacement of normal bone marrow signal throughout the L4  vertebral body extending into the pedicles and facets bilaterally, however only mild edema type signal is present on STIR images. The right L4 pedicle appears expanded with small  volume extraosseous tumor not excluded in this region, and there is bulging of the vertebral body cortex diffusely including posteriorly into the spinal canal with small volume epidural tumor not excluded. Two small T1 hypointense lesions are present posteriorly in the L3 vertebral body without associated fracture. A 2.5 cm lesion is partially visualized in the posterior right ilium. Conus medullaris and cauda equina: Conus extends to the upper L1 level. Conus and cauda equina appear normal. Paraspinal and other soft tissues: Unremarkable. Disc levels: Disc desiccation throughout the lumbar spine. T12-L1 through L2-3: Negative. L3-4: Disc bulging, L4 vertebral body bulging related to the fracture, congenitally short pedicles, and moderate facet hypertrophy result in severe spinal stenosis with asymmetric right lateral recess stenosis. No significant neural foraminal stenosis. L4-5: L4 vertebral body bulging related to the fracture, possible small volume epidural tumor, congenitally short pedicles, and moderate facet and ligamentum flavum hypertrophy result in moderate spinal stenosis, asymmetric left lateral recess stenosis, and mild bilateral neural foraminal stenosis. L5-S1: Mild right and moderate left facet hypertrophy without disc herniation or stenosis. IMPRESSION: 1. Pathologic L4 vertebral body fracture with 70% height loss. Mild vertebral body retropulsion contributing to severe multifactorial spinal stenosis. Small volume extraosseous/epidural tumor not excluded. 2. Additional lesions in the L3 vertebral body and right ilium. Findings may reflect metastatic disease or multiple myeloma. These results will be called to the ordering clinician or representative by the Radiologist Assistant, and communication documented in the PACS or zVision Dashboard. Electronically Signed   By: Logan Bores M.D.   On: 10/29/2018 08:59    Assessment and plan- Patient is a 68 y.o. female referred for L4 pathological  compression fracture concerning for metastatic disease  I have reviewed MRI lumbar spine images independently and discussed findings with the patient.  I also discussed her case with interventional radiology today to see if kyphoplasty with biopsy would be an option.  IR is of the opinion that given evidence of retropulsion and severe stenosis, kyphoplasty is not an option.  They could certainly try IR guided biopsy.  I have also spoken to Dr. Lacinda Axon from neurosurgery who will discuss with Dr. Cari Caraway tomorrow if there would be any role for neurosurgical intervention.  At this time I will obtain a PET CT scan that is scheduled for tomorrow to assess the extent of metastatic disease and if there are any other potential amenable areas for biopsy.  I will also discuss her case with radiation oncology tomorrow.  I will tentatively see the patient back in 1 week's time to discuss the results of the PET CT scan and biopsy and further management.  Patient does report some spasms as well as tingling and pain in her lower back as well as bilateral lower extremities.  She has mild weakness in her right lower extremity mainly due to pain but is able to ambulate.  Reports no numbness.  No bowel bladder incontinence.  Clinically she does not have any emergent cord compression syndrome that would require inpatient admission or urgent steroids.  I have asked her to go to the ER if she were to develop any new focal numbness weakness or bladder or bowel incontinence until she is seen by neurosurgery and/or radiation oncology.  I will also obtain a myeloma work-up today including a CBC, CMP, myeloma panel and serum free light chains.  I suspect that her metastatic disease however is due to her underlying breast cancer which needs to be confirmed by biopsy.  Patient will obtain her prior breast cancer records for is and I will check her tumor markers including CA-27-29 and CA-15-3 today  Thank you for this kind referral and  the opportunity to participate in the care of this patient   Total face to face encounter time for this patient visit was 40 min. >50% of the time was  spent in counseling and coordination of care.     Visit Diagnosis 1. History of breast cancer   2. 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 Randa Evens, MD, MPH [facility] at [facility] [phone] 11/01/2018 3:39 PM

## 2018-11-01 NOTE — Progress Notes (Deleted)
Hematology/Oncology Consult note [facility]  Telephone:([phone] [phone]  Patient Care Team: Virginia Crews, MD as PCP - General (Family Medicine) Beverly Gust, MD (Otolaryngology)   Name of the patient: Verlin Duke  [id number]  1950-12-23   Date of visit: 11/01/18  Diagnosis- ***  Chief complaint/ Reason for visit- ***  Heme/Onc history: ***  Interval history- ***  ECOG PS- *** Pain scale- *** Opioid associated constipation- ***  Review of systems- ROS   Current treatment- ***  Allergies  Allergen Reactions  . Penicillins Shortness Of Breath and Swelling    Facial swelling  . Sulfa Antibiotics Swelling    Facial swelling  . Atorvastatin Other (See Comments)    myalgias  . Rosuvastatin Calcium Other (See Comments)    Myalgias      Past Medical History:  Diagnosis Date  . Breast cancer ([place]) 2008   R breast  . Personal history of chemotherapy 03/2007   right breast ca  . Personal history of radiation therapy 2008   right breast ca  . Thyroid nodule 2017     Past Surgical History:  Procedure Laterality Date  . APPENDECTOMY  1958  . BARTHOLIN GLAND CYST EXCISION  1978  . BREAST BIOPSY Right 03/19/2007   carcinoma  . BREAST BIOPSY Right 08/10/2013   rt breast 3:00 coil clip benign per pt done in [place], 9:00 wing clip benign per pt done in [place] Right ?   ribbon marker benign  . BREAST EXCISIONAL BIOPSY Left 2017   pt states benign lymph nodes removed from axilla  . BREAST LUMPECTOMY Right 08/11/2007   with axillary dissection.   Marland Kitchen [place]   x [place]  . TUBAL LIGATION  1976    Social History   Socioeconomic History  . Marital status: Divorced    Spouse name: Not on file  . Number of children: 3  . Years of education: Not on file  . Highest education level: Master's degree (e.g., MA, MS, MEng, MEd, MSW, MBA)  Occupational History  .  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 Financial resource strain: Not hard at all  . Food insecurity:    Worry: Never true    Inability: Never true  . Transportation needs:    Medical: No    Non-medical: No  Tobacco Use  . Smoking status: Former Smoker    Packs/day: 2.00    Years: 38.00    Pack years: 76.00    Types: Cigarettes    Last attempt to quit: 10/26/1997    Years since quitting: 21.0  . Smokeless tobacco: Never Used  Substance and Sexual Activity  . Alcohol use: Yes    Alcohol/week: 7.0 standard drinks    Types: 7 Glasses of wine per week  . Drug use: No  . Sexual activity: Never    Birth control/protection: Surgical  Lifestyle  . Physical activity:    Days per week: Not on file    Minutes per session: Not on file  . Stress: Not at all  Relationships  . Social connections:    Talks on phone: Not on file    Gets together: Not on file    Attends religious service: Not on file    Active member of club or organization: Not on file    Attends meetings of clubs or  organizations: Not on file    Relationship status: Not on file  . Intimate partner violence:    Fear of current or ex partner: Not on file    Emotionally abused: Not on file    Physically abused: Not on file    Forced sexual activity: Not on file  Other Topics Concern  . Not on file  Social History Narrative  . Not on file    Family History  Problem Relation Age of Onset  . Dementia Mother   . Stroke Mother 16  . Pancreatic cancer Father   . Prostate cancer Father   . HIV Brother   . Breast cancer Neg Hx   . Colon cancer Neg Hx      Current Outpatient Medications:  .  Biotin 5000 MCG CAPS, Take 5,000 mcg by mouth daily., Disp: , Rfl:  .  cholecalciferol (VITAMIN D) 1000 units tablet, Take 2,000 Units by mouth daily. , Disp: , Rfl:  .  cyclobenzaprine (FLEXERIL) 5 MG tablet, Take 1 tablet (5 mg total) by mouth 3 (three) times  daily as needed for muscle spasms. (Patient taking differently: Take 5 mg by mouth 3 (three) times daily as needed for muscle spasms. ), Disp: 30 tablet, Rfl: 1 .  Multiple Vitamins-Minerals (CENTRUM ADULTS PO), Take by mouth., Disp: , Rfl:   Physical exam:  Vitals:   11/01/18 1306  BP: (!) 149/84  Pulse: 98  Resp: 18  Temp: (!) 96.9 F (36.1 C)  TempSrc: Tympanic  Weight: 169 lb 8 oz (76.9 kg)  Height: 5' 0.04" (1.525 m)   Physical Exam   CMP Latest Ref Rng & Units 11/01/2018  Glucose 70 - 99 mg/dL 110(H)  BUN 8 - 23 mg/dL 16  Creatinine 0.44 - 1.00 mg/dL 0.58  Sodium 135 - 145 mmol/L 139  Potassium 3.5 - 5.1 mmol/L 3.6  Chloride 98 - 111 mmol/L 103  CO2 22 - 32 mmol/L 24  Calcium 8.9 - 10.3 mg/dL 9.2  Total Protein 6.5 - 8.1 g/dL 7.4  Total Bilirubin 0.3 - 1.2 mg/dL 0.6  Alkaline Phos 38 - 126 U/L 101  AST 15 - 41 U/L 41  ALT 0 - 44 U/L 59(H)   CBC Latest Ref Rng & Units 11/01/2018  WBC 4.0 - 10.5 K/uL 6.6  Hemoglobin 12.0 - 15.0 g/dL 14.7  Hematocrit 36.0 - 46.0 % 45.0  Platelets 150 - 400 K/uL 201    No images are attached to the encounter.  Mr Lumbar Spine Wo Contrast  Result Date: 10/29/2018 CLINICAL DATA:  Right buttock and leg pain with numbness and tingling. [place] off of a treadmill landing on the right buttock 4-5 months ago. L4 compression fracture. History of breast cancer. EXAM: MRI LUMBAR SPINE WITHOUT CONTRAST TECHNIQUE: Multiplanar, multisequence MR imaging of the lumbar spine was performed. No intravenous contrast was administered. COMPARISON:  Lumbar spine radiographs 10/01/2018 FINDINGS: Segmentation:  Standard. Alignment:  Normal. Vertebrae: There is an L4 vertebral body fracture with 70% maximal height loss centrally. There is complete replacement of normal bone marrow signal throughout the L4 vertebral body extending into the pedicles and facets bilaterally, however only mild edema type signal is present on STIR images. The right L4 pedicle appears expanded  with small volume extraosseous tumor not excluded in this region, and there is bulging of the vertebral body cortex diffusely including posteriorly into the spinal canal with small volume epidural tumor not excluded. Two small T1 hypointense lesions are present posteriorly in the L3  vertebral body without associated fracture. A 2.5 cm lesion is partially visualized in the posterior right ilium. Conus medullaris and cauda equina: Conus extends to the upper L1 level. Conus and cauda equina appear normal. Paraspinal and other soft tissues: Unremarkable. Disc levels: Disc desiccation throughout the lumbar spine. T12-L1 through L2-3: Negative. L3-4: Disc bulging, L4 vertebral body bulging related to the fracture, congenitally short pedicles, and moderate facet hypertrophy result in severe spinal stenosis with asymmetric right lateral recess stenosis. No significant neural foraminal stenosis. L4-5: L4 vertebral body bulging related to the fracture, possible small volume epidural tumor, congenitally short pedicles, and moderate facet and ligamentum flavum hypertrophy result in moderate spinal stenosis, asymmetric left lateral recess stenosis, and mild bilateral neural foraminal stenosis. L5-S1: Mild right and moderate left facet hypertrophy without disc herniation or stenosis. IMPRESSION: 1. Pathologic L4 vertebral body fracture with 70% height loss. Mild vertebral body retropulsion contributing to severe multifactorial spinal stenosis. Small volume extraosseous/epidural tumor not excluded. 2. Additional lesions in the L3 vertebral body and right ilium. Findings may reflect metastatic disease or multiple myeloma. These results will be called to the ordering clinician or representative by the Radiologist Assistant, and communication documented in the PACS or zVision Dashboard. Electronically Signed   By: Logan Bores M.D.   On: 10/29/2018 08:59     Assessment and plan- Patient is a 68 y.o. female ***   Visit  Diagnosis 1. History of breast cancer   2. Closed fracture of lumbar vertebra with spinal cord injury, initial encounter [facility])      Dr. Randa Evens, MD, MPH [facility] at [facility] [phone] 11/01/2018 3:29 PM

## 2018-11-02 ENCOUNTER — Other Ambulatory Visit: Payer: Self-pay | Admitting: *Deleted

## 2018-11-02 ENCOUNTER — Ambulatory Visit
Admission: RE | Admit: 2018-11-02 | Discharge: 2018-11-02 | Disposition: A | Discharge status: Home / Self Care | Payer: Medicare Other | Source: Ambulatory Visit | Attending: Oncology | Admitting: Oncology

## 2018-11-02 DIAGNOSIS — I700 Atherosclerosis of aorta: Secondary | ICD-10-CM | POA: Diagnosis not present

## 2018-11-02 DIAGNOSIS — M899 Disorder of bone, unspecified: Secondary | ICD-10-CM | POA: Diagnosis not present

## 2018-11-02 DIAGNOSIS — R948 Abnormal results of function studies of other organs and systems: Secondary | ICD-10-CM

## 2018-11-02 DIAGNOSIS — I2510 Atherosclerotic heart disease of native coronary artery without angina pectoris: Secondary | ICD-10-CM | POA: Insufficient documentation

## 2018-11-02 DIAGNOSIS — K760 Fatty (change of) liver, not elsewhere classified: Secondary | ICD-10-CM | POA: Diagnosis not present

## 2018-11-02 DIAGNOSIS — C50919 Malignant neoplasm of unspecified site of unspecified female breast: Secondary | ICD-10-CM | POA: Diagnosis not present

## 2018-11-02 DIAGNOSIS — S32040A Wedge compression fracture of fourth lumbar vertebra, initial encounter for closed fracture: Secondary | ICD-10-CM

## 2018-11-02 DIAGNOSIS — M4856XA Collapsed vertebra, not elsewhere classified, lumbar region, initial encounter for fracture: Secondary | ICD-10-CM | POA: Insufficient documentation

## 2018-11-02 DIAGNOSIS — Z853 Personal history of malignant neoplasm of breast: Secondary | ICD-10-CM

## 2018-11-02 LAB — MULTIPLE MYELOMA PANEL, SERUM
ALPHA 1: 0.2 g/dL (ref 0.0–0.4)
Albumin SerPl Elph-Mcnc: 3.8 g/dL (ref 2.9–4.4)
Albumin/Glob SerPl: 1.4 (ref 0.7–1.7)
Alpha2 Glob SerPl Elph-Mcnc: 0.7 g/dL (ref 0.4–1.0)
B-Globulin SerPl Elph-Mcnc: 1.2 g/dL (ref 0.7–1.3)
GLOBULIN, TOTAL: 2.9 g/dL (ref 2.2–3.9)
Gamma Glob SerPl Elph-Mcnc: 0.8 g/dL (ref 0.4–1.8)
IGA: 289 mg/dL (ref 87–352)
IgG (Immunoglobin G), Serum: 959 mg/dL (ref 700–1600)
IgM (Immunoglobulin M), Srm: 44 mg/dL (ref 26–217)
TOTAL PROTEIN ELP: 6.7 g/dL (ref 6.0–8.5)

## 2018-11-02 LAB — CANCER ANTIGEN 15-3: CA 15-3: 31.6 U/mL — ABNORMAL HIGH (ref 0.0–25.0)

## 2018-11-02 LAB — KAPPA/LAMBDA LIGHT CHAINS
KAPPA FREE LGHT CHN: 14 mg/L (ref 3.3–19.4)
KAPPA, LAMDA LIGHT CHAIN RATIO: 0.99 (ref 0.26–1.65)
LAMDA FREE LIGHT CHAINS: 14.1 mg/L (ref 5.7–26.3)

## 2018-11-02 LAB — CANCER ANTIGEN 27.29: CA 27.29: 40.7 U/mL — ABNORMAL HIGH (ref 0.0–38.6)

## 2018-11-02 LAB — GLUCOSE, CAPILLARY: Glucose-Capillary: 103 mg/dL — ABNORMAL HIGH (ref 70–99)

## 2018-11-02 MED ORDER — FLUDEOXYGLUCOSE F - 18 (FDG) INJECTION
8.6000 | Freq: Once | INTRAVENOUS | Status: AC | PRN
  Administered 2018-11-02: 8.79 via INTRAVENOUS

## 2018-11-03 ENCOUNTER — Other Ambulatory Visit: Payer: Self-pay | Admitting: *Deleted

## 2018-11-03 ENCOUNTER — Telehealth: Payer: Self-pay | Admitting: *Deleted

## 2018-11-03 DIAGNOSIS — S32040A Wedge compression fracture of fourth lumbar vertebra, initial encounter for closed fracture: Secondary | ICD-10-CM

## 2018-11-03 DIAGNOSIS — Z853 Personal history of malignant neoplasm of breast: Secondary | ICD-10-CM

## 2018-11-03 DIAGNOSIS — R948 Abnormal results of function studies of other organs and systems: Secondary | ICD-10-CM

## 2018-11-03 NOTE — Telephone Encounter (Signed)
Call placed to [facility] neurosurgery and they confirmed that patient has cancelled her biopsy/kyphoplasy for tomorrow.

## 2018-11-03 NOTE — Telephone Encounter (Signed)
Patient called me back stating she is thoroughly confused. She got a phone call from Dr Rudene Christians office trying to schedule kyphoplasty and biopsy tomm.Patient does not want that cement procedure all she wants is the biopsy. She wants to keep her appt for Monday am at [facility] IR Patient states her kids are flying in from [place] on weekend and will be going with her to Biopsy on Monday and then seeing Dr Janese Banks Monday afternoon. She wants procedure Cancelled with Dr Rudene Christians tomm.   I spoke with Dr Janese Banks to aprise her of above and she is aware. She would like a consult with Dr Baruch Gouty on Monday . She said to change her appt to Thurs or Friday of next week when she would have the biopsy results.Marland Kitchen

## 2018-11-03 NOTE — Telephone Encounter (Signed)
I have requested patient to call me in regards to her Biopsy which is scheduled for Monday Jan 13th. Arrival time is 8am at [facility]. Report to registration desk for a Biopsy of Right iliac bone. Patient will need a driver to take her home. She will need to be NPO after midnight.

## 2018-11-03 NOTE — Telephone Encounter (Signed)
error 

## 2018-11-04 ENCOUNTER — Ambulatory Visit: Admit: 2018-11-04 | Payer: PRIVATE HEALTH INSURANCE | Admitting: Orthopedic Surgery

## 2018-11-04 SURGERY — KYPHOPLASTY
Anesthesia: Choice

## 2018-11-05 ENCOUNTER — Other Ambulatory Visit: Payer: Self-pay

## 2018-11-05 ENCOUNTER — Other Ambulatory Visit: Payer: Self-pay | Admitting: Radiology

## 2018-11-05 ENCOUNTER — Encounter: Payer: Self-pay | Admitting: Radiation Oncology

## 2018-11-05 ENCOUNTER — Ambulatory Visit
Admission: RE | Admit: 2018-11-05 | Discharge: 2018-11-05 | Disposition: A | Discharge status: Home / Self Care | Payer: Medicare Other | Source: Ambulatory Visit | Attending: Radiation Oncology | Admitting: Radiation Oncology

## 2018-11-05 VITALS — BP 164/82 | HR 89 | Temp 96.3°F | Resp 18 | Wt 168.3 lb

## 2018-11-05 DIAGNOSIS — Z87891 Personal history of nicotine dependence: Secondary | ICD-10-CM | POA: Insufficient documentation

## 2018-11-05 DIAGNOSIS — M899 Disorder of bone, unspecified: Secondary | ICD-10-CM | POA: Diagnosis not present

## 2018-11-05 DIAGNOSIS — C7951 Secondary malignant neoplasm of bone: Secondary | ICD-10-CM

## 2018-11-05 DIAGNOSIS — Z923 Personal history of irradiation: Secondary | ICD-10-CM | POA: Insufficient documentation

## 2018-11-05 DIAGNOSIS — Z8042 Family history of malignant neoplasm of prostate: Secondary | ICD-10-CM | POA: Insufficient documentation

## 2018-11-05 DIAGNOSIS — Z853 Personal history of malignant neoplasm of breast: Secondary | ICD-10-CM | POA: Diagnosis not present

## 2018-11-05 DIAGNOSIS — Z79899 Other long term (current) drug therapy: Secondary | ICD-10-CM | POA: Diagnosis not present

## 2018-11-05 DIAGNOSIS — Z9221 Personal history of antineoplastic chemotherapy: Secondary | ICD-10-CM | POA: Diagnosis not present

## 2018-11-05 NOTE — Consult Note (Signed)
NEW PATIENT EVALUATION  Name: April Stuart  MRN: [id number]  Date:   11/05/2018     DOB: May 22, 1951   This 68 y.o. female patient presents to the clinic for initial evaluation of metastatic disease to L4 vertebral body in patient with no history of breast cancer.  REFERRING PHYSICIAN: Virginia Crews, MD  CHIEF COMPLAINT:  Chief Complaint  Patient presents with  . Cancer    DIAGNOSIS: The encounter diagnosis was Bone metastasis ([place]).   PREVIOUS INVESTIGATIONS:  MRI and PET/CT scan reviewed Clinical notes reviewed Biopsy pending  HPI: patient is a 67 year old female with known history ofbreast cancer back in 2008. She had neoadjuvant chemotherapy lumpectomy and adjuvant radiation therapy. She also 5 years of hormonal therapy. She reports falling off a treadmill about 5 months prior then having consistent ongoing back pain which is progressively worse. She has no focal neurologic deficits or motor sensory level. She underwent a x-ray of her spine 10/04/2018 showing L4 compression fracture. MRI showed L4 vertebral fracture with complete bone marrow replacement extending into the pedicles and facets laterally. Pedicles were expanded consistent with possible tumor involvement.there is also 2 hypodense lesions located in L3 as well as a 2.5 cm in the posterior right ilium all consistent with metastatic bone disease. She scheduled for biopsy of this Monday. I been asked to evaluate her for possible palliative radiation therapy to her lumbar spine. She is ambulating with a limp. PET CT scan demonstrated multiple osseous lesions including the site of L4 fracture. No primary definitive lesion was noted.  PLANNED TREATMENT REGIMEN: palliative radiation therapy to lumbar spine  PAST MEDICAL HISTORY:  has a past medical history of Breast cancer ([place]) (2008), Personal history of chemotherapy (03/2007), Personal history of radiation therapy (2008), and Thyroid nodule (2017).    PAST SURGICAL  HISTORY:  Past Surgical History:  Procedure Laterality Date  . APPENDECTOMY  1958  . BARTHOLIN GLAND CYST EXCISION  1978  . BREAST BIOPSY Right 03/19/2007   carcinoma  . BREAST BIOPSY Right 08/10/2013   rt breast 3:00 coil clip benign per pt done in [place], 9:00 wing clip benign per pt done in [place] Right ?   ribbon marker benign  . BREAST EXCISIONAL BIOPSY Left 2017   pt states benign lymph nodes removed from axilla  . BREAST LUMPECTOMY Right 08/11/2007   with axillary dissection.   Marland Kitchen [place]   x [place]  . TUBAL LIGATION  1976    FAMILY HISTORY: family history includes Dementia in her mother; HIV in her brother; Pancreatic cancer in her father; Prostate cancer in her father; Stroke (age of onset: 62) in her mother.  SOCIAL HISTORY:  reports that she quit smoking about 21 years ago. Her smoking use included cigarettes. She has a 76.00 pack-year smoking history. She has never used smokeless tobacco. She reports current alcohol use of about 7.0 standard drinks of alcohol per week. She reports that she does not use drugs.  ALLERGIES: Penicillins; Sulfa antibiotics; Atorvastatin; and Rosuvastatin calcium  MEDICATIONS:  Current Outpatient Medications  Medication Sig Dispense Refill  . Biotin 5000 MCG CAPS Take 5,000 mcg by mouth daily.    . cholecalciferol (VITAMIN D) 1000 units tablet Take 2,000 Units by mouth daily.     . cyclobenzaprine (FLEXERIL) 5 MG tablet Take 1 tablet (5 mg total) by mouth 3 (three) times daily as needed for muscle spasms. (Patient taking differently:  Take 5 mg by mouth 3 (three) times daily as needed for muscle spasms. ) 30 tablet 1  . Multiple Vitamins-Minerals (CENTRUM ADULTS PO) Take by mouth.     No current facility-administered medications for this encounter.     ECOG PERFORMANCE STATUS:  1 - Symptomatic but completely ambulatory  REVIEW OF SYSTEMS: except for the back pain and some difficulty  ambulating Patient denies any weight loss, fatigue, weakness, fever, chills or night sweats. Patient denies any loss of vision, blurred vision. Patient denies any ringing  of the ears or hearing loss. No irregular heartbeat. Patient denies heart murmur or history of fainting. Patient denies any chest pain or pain radiating to her upper extremities. Patient denies any shortness of breath, difficulty breathing at night, cough or hemoptysis. Patient denies any swelling in the lower legs. Patient denies any nausea vomiting, vomiting of blood, or coffee ground material in the vomitus. Patient denies any stomach pain. Patient states has had normal bowel movements no significant constipation or diarrhea. Patient denies any dysuria, hematuria or significant nocturia. Patient denies any problems walking, swelling in the joints or loss of balance. Patient denies any skin changes, loss of hair or loss of weight. Patient denies any excessive worrying or anxiety or significant depression. Patient denies any problems with insomnia. Patient denies excessive thirst, polyuria, polydipsia. Patient denies any swollen glands, patient denies easy bruising or easy bleeding. Patient denies any recent infections, allergies or URI. Patient "s visual fields have not changed significantly in recent time.    PHYSICAL EXAM: BP (!) 164/82   Pulse 89   Temp (!) 96.3 F (35.7 C)   Resp 18   Wt 168 lb 5.1 oz (76.4 kg)   BMI 32.83 kg/m  Motor sensory and DTR levels are equal and symmetric in lower extremities. Proprioception is intact. Deep palpation of her spine does not elicit pain. Range of motion of her lower extremities does not elicit pain.Well-developed well-nourished patient in NAD. HEENT reveals PERLA, EOMI, discs not visualized.  Oral cavity is clear. No oral mucosal lesions are identified. Neck is clear without evidence of cervical or supraclavicular adenopathy. Lungs are clear to A&P. Cardiac examination is essentially  unremarkable with regular rate and rhythm without murmur rub or thrill. Abdomen is benign with no organomegaly or masses noted. Motor sensory and DTR levels are equal and symmetric in the upper and lower extremities. Cranial nerves II through XII are grossly intact. Proprioception is intact. No peripheral adenopathy or edema is identified. No motor or sensory levels are noted. Crude visual fields are within normal range.  LABORATORY DATA: pathology pending    RADIOLOGY RESULTS:MRI scan and PET/CT scan reviewed   IMPRESSION: stage IV breast cancer with compression fracture of L4 due to tumor in 68 year old female  PLAN: at this time we'll await biopsy results although I'm going ahead with treatment planning to her lumbar spine. Would plan on delivering 3000 cGy in 10 fractions. I will corporate other levels of the spine demonstrating early metastatic involvement on her PET/CT scan. Risks and benefits of treatment including skin reaction fatigue alteration of blood counts possible diarrhea all were discussed in detail with the patient and her family. I have personally set up and ordered CT simulation for next week after biopsy results. Patient is to call with any concerns.  I would like to take this opportunity to thank you for allowing me to participate in the care of your patient.Noreene Filbert, MD

## 2018-11-08 ENCOUNTER — Ambulatory Visit: Payer: PRIVATE HEALTH INSURANCE | Admitting: Oncology

## 2018-11-08 ENCOUNTER — Encounter: Payer: Self-pay | Admitting: Oncology

## 2018-11-08 ENCOUNTER — Other Ambulatory Visit: Payer: Self-pay | Admitting: Oncology

## 2018-11-08 ENCOUNTER — Ambulatory Visit
Admission: RE | Admit: 2018-11-08 | Discharge: 2018-11-08 | Disposition: A | Discharge status: Home / Self Care | Payer: Medicare Other | Source: Ambulatory Visit | Attending: Oncology | Admitting: Oncology

## 2018-11-08 ENCOUNTER — Other Ambulatory Visit: Payer: Self-pay

## 2018-11-08 DIAGNOSIS — M899 Disorder of bone, unspecified: Secondary | ICD-10-CM | POA: Insufficient documentation

## 2018-11-08 DIAGNOSIS — Z1239 Encounter for other screening for malignant neoplasm of breast: Secondary | ICD-10-CM | POA: Diagnosis not present

## 2018-11-08 DIAGNOSIS — M898X8 Other specified disorders of bone, other site: Secondary | ICD-10-CM | POA: Diagnosis not present

## 2018-11-08 DIAGNOSIS — S32040A Wedge compression fracture of fourth lumbar vertebra, initial encounter for closed fracture: Secondary | ICD-10-CM | POA: Insufficient documentation

## 2018-11-08 DIAGNOSIS — Z853 Personal history of malignant neoplasm of breast: Secondary | ICD-10-CM | POA: Diagnosis not present

## 2018-11-08 DIAGNOSIS — Z01812 Encounter for preprocedural laboratory examination: Secondary | ICD-10-CM | POA: Insufficient documentation

## 2018-11-08 DIAGNOSIS — R948 Abnormal results of function studies of other organs and systems: Secondary | ICD-10-CM | POA: Diagnosis not present

## 2018-11-08 LAB — CBC WITH DIFFERENTIAL/PLATELET
ABS IMMATURE GRANULOCYTES: 0.03 10*3/uL (ref 0.00–0.07)
Basophils Absolute: 0 10*3/uL (ref 0.0–0.1)
Basophils Relative: 1 %
Eosinophils Absolute: 0.2 10*3/uL (ref 0.0–0.5)
Eosinophils Relative: 3 %
HCT: 43.5 % (ref 36.0–46.0)
Hemoglobin: 14.5 g/dL (ref 12.0–15.0)
IMMATURE GRANULOCYTES: 1 %
Lymphocytes Relative: 38 %
Lymphs Abs: 2.2 10*3/uL (ref 0.7–4.0)
MCH: 30.1 pg (ref 26.0–34.0)
MCHC: 33.3 g/dL (ref 30.0–36.0)
MCV: 90.4 fL (ref 80.0–100.0)
Monocytes Absolute: 0.6 10*3/uL (ref 0.1–1.0)
Monocytes Relative: 10 %
Neutro Abs: 2.8 10*3/uL (ref 1.7–7.7)
Neutrophils Relative %: 47 %
Platelets: 190 10*3/uL (ref 150–400)
RBC: 4.81 MIL/uL (ref 3.87–5.11)
RDW: 12.9 % (ref 11.5–15.5)
WBC: 5.8 10*3/uL (ref 4.0–10.5)
nRBC: 0 % (ref 0.0–0.2)

## 2018-11-08 LAB — PROTIME-INR
INR: 0.92
Prothrombin Time: 12.3 seconds (ref 11.4–15.2)

## 2018-11-08 MED ORDER — LIDOCAINE HCL (PF) 1 % IJ SOLN
INTRAMUSCULAR | Status: AC | PRN
  Administered 2018-11-08: 15 mL

## 2018-11-08 MED ORDER — FENTANYL CITRATE (PF) 100 MCG/2ML IJ SOLN
INTRAMUSCULAR | Status: AC
  Filled 2018-11-08: qty 4

## 2018-11-08 MED ORDER — HYDROCODONE-ACETAMINOPHEN 5-325 MG PO TABS
1.0000 | ORAL_TABLET | ORAL | Status: DC | PRN
  Filled 2018-11-08: qty 2

## 2018-11-08 MED ORDER — MIDAZOLAM HCL 5 MG/5ML IJ SOLN
INTRAMUSCULAR | Status: AC | PRN
  Administered 2018-11-08 (×2): 1 mg via INTRAVENOUS
  Administered 2018-11-08: 0.5 mg via INTRAVENOUS

## 2018-11-08 MED ORDER — FENTANYL CITRATE (PF) 100 MCG/2ML IJ SOLN
INTRAMUSCULAR | Status: AC | PRN
  Administered 2018-11-08: 25 ug via INTRAVENOUS
  Administered 2018-11-08: 50 ug via INTRAVENOUS

## 2018-11-08 MED ORDER — OXYCODONE HCL 5 MG PO TABS: 5.0000 mg | ORAL_TABLET | Freq: Four times a day (QID) | ORAL | 0 refills | Status: AC | PRN

## 2018-11-08 MED ORDER — SODIUM CHLORIDE 0.9 % IV SOLN
INTRAVENOUS | Status: DC
  Administered 2018-11-08: 09:00:00 via INTRAVENOUS

## 2018-11-08 MED ORDER — MIDAZOLAM HCL 5 MG/5ML IJ SOLN
INTRAMUSCULAR | Status: AC
  Filled 2018-11-08: qty 5

## 2018-11-08 NOTE — Procedures (Signed)
Interventional Radiology Procedure Note  Procedure: CT Guided Biopsy of Right Iliac Bone Lesion  Complications: None  Estimated Blood Loss: < 10 mL  Findings: 37 G core biopsy of right posterior iliac bone lesion performed under CT guidance.  Intact core sample obtained and sent to Pathology.  Venetia Night. Kathlene Cote, M.D Pager:  [phone]

## 2018-11-08 NOTE — Telephone Encounter (Signed)
I will send oxycodone to her pharmacy. Which pharmacy does she use?

## 2018-11-08 NOTE — H&P (Signed)
Chief Complaint: Patient was seen in consultation today for right iliac bone lesion biopsy at the request of Rao,Archana C  Referring Physician(s): Rao,Archana C  Patient Status: [place]  History of Present Illness: April Stuart is a 68 y.o. female with a history ofright breast cancer in 2008, treated with chemotherapy, lumpectomy and radiation in [place]. Recently presented with severe LBP with MRI demonstrating moderate L4 compression and complete replacement of L4 vertebral body by tumor. Smaller posterior L3 lesion present. PET also shows anterior and posterior right iliac bone lesions. Patient still with significant LBP which limits activity. Also is having some pain in right pelvis corresponding to locations of right iliac lesions.  Past Medical History:  Diagnosis Date  . Breast cancer ([place]) 2008   R breast  . Personal history of chemotherapy 03/2007   right breast ca  . Personal history of radiation therapy 2008   right breast ca  . Thyroid nodule 2017    Past Surgical History:  Procedure Laterality Date  . APPENDECTOMY  1958  . BARTHOLIN GLAND CYST EXCISION  1978  . BREAST BIOPSY Right 03/19/2007   carcinoma  . BREAST BIOPSY Right 08/10/2013   rt breast 3:00 coil clip benign per pt done in [place], 9:00 wing clip benign per pt done in [place] Right ?   ribbon marker benign  . BREAST EXCISIONAL BIOPSY Left 2017   pt states benign lymph nodes removed from axilla  . BREAST LUMPECTOMY Right 08/11/2007   with axillary dissection.   Marland Kitchen [place]   x [place]  . TUBAL LIGATION  1976    Allergies: Penicillins; Sulfa antibiotics; Atorvastatin; and Rosuvastatin calcium  Medications: Prior to Admission medications   Medication Sig Start Date End Date Taking? Authorizing Provider  Biotin 5000 MCG CAPS Take 5,000 mcg by mouth daily.    [provider]  cholecalciferol (VITAMIN D) 1000 units tablet Take  2,000 Units by mouth daily.     [provider]  cyclobenzaprine (FLEXERIL) 5 MG tablet Take 1 tablet (5 mg total) by mouth 3 (three) times daily as needed for muscle spasms. Patient not taking: Reported on 11/08/2018 10/01/18   Virginia Crews, MD  Multiple Vitamins-Minerals (CENTRUM ADULTS PO) Take by mouth.    [provider]     Family History  Problem Relation Age of Onset  . Dementia Mother   . Stroke Mother 62  . Pancreatic cancer Father   . Prostate cancer Father   . HIV Brother   . Breast cancer Neg Hx   . Colon cancer Neg Hx     Social History   Socioeconomic History  . Marital status: Divorced    Spouse name: Not on file  . Number of children: 3  . Years of education: Not on file  . Highest education level: Master's degree (e.g., MA, MS, MEng, MEd, MSW, MBA)  Occupational History  . 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 Financial resource strain: Not hard at all  . Food insecurity:    Worry: Never true    Inability: Never true  . Transportation needs:    Medical: No    Non-medical: No  Tobacco Use  . Smoking status: Former Smoker    Packs/day: 2.00    Years: 38.00    Pack years: 76.00    Types: Cigarettes  Last attempt to quit: 10/26/1997    Years since quitting: 21.0  . Smokeless tobacco: Never Used  Substance and Sexual Activity  . Alcohol use: Yes    Alcohol/week: 7.0 standard drinks    Types: 7 Glasses of wine per week  . Drug use: No  . Sexual activity: Never    Birth control/protection: Surgical  Lifestyle  . Physical activity:    Days per week: Not on file    Minutes per session: Not on file  . Stress: Not at all  Relationships  . Social connections:    Talks on phone: Not on file    Gets together: Not on file    Attends religious service: Not on file    Active member of club or organization: Not on file    Attends meetings of clubs or organizations:  Not on file    Relationship status: Not on file  Other Topics Concern  . Not on file  Social History Narrative  . Not on file    ECOG Status: 2 - Symptomatic, <50% confined to bed  Review of Systems: A 12 point ROS discussed and pertinent positives are indicated in the HPI above.  All other systems are negative.  Review of Systems  Constitutional: Positive for activity change.  Respiratory: Negative.   Cardiovascular: Negative.   Gastrointestinal: Negative.   Genitourinary: Positive for pelvic pain. Negative for dysuria, flank pain, frequency and hematuria.  Musculoskeletal: Positive for back pain.  Neurological: Negative.     Vital Signs: BP (!) 148/67   Pulse 97   Temp 97.7 F (36.5 C) (Oral)   Resp 16   Ht 5' (1.524 m)   Wt 75.8 kg   SpO2 97%   BMI 32.61 kg/m   Physical Exam Vitals signs reviewed.  Constitutional:      General: She is not in acute distress.    Appearance: She is not toxic-appearing or diaphoretic.  HENT:     Head: Normocephalic and atraumatic.  Neck:     Musculoskeletal: Neck supple. No muscular tenderness.  Cardiovascular:     Rate and Rhythm: Normal rate and regular rhythm.     Pulses: Normal pulses.     Heart sounds: Normal heart sounds. No murmur. No gallop.   Pulmonary:     Effort: Pulmonary effort is normal. No respiratory distress.     Breath sounds: No stridor. No wheezing, rhonchi or rales.     Comments: Some decreased breath sounds at left base. Abdominal:     General: Abdomen is flat. Bowel sounds are normal. There is no distension.     Palpations: Abdomen is soft. There is no mass.     Tenderness: There is no abdominal tenderness. There is no guarding or rebound.  Musculoskeletal:        General: No swelling.     Right lower leg: No edema.     Left lower leg: No edema.  Lymphadenopathy:     Cervical: No cervical adenopathy.  Skin:    General: Skin is warm and dry.  Neurological:     Mental Status: She is alert and  oriented to person, place, and time.     Imaging: Mr Lumbar Spine Wo Contrast  Result Date: 10/29/2018 CLINICAL DATA:  Right buttock and leg pain with numbness and tingling. [place] off of a treadmill landing on the right buttock 4-5 months ago. L4 compression fracture. History of breast cancer. EXAM: MRI LUMBAR SPINE WITHOUT CONTRAST TECHNIQUE: Multiplanar, multisequence MR imaging of  the lumbar spine was performed. No intravenous contrast was administered. COMPARISON:  Lumbar spine radiographs 10/01/2018 FINDINGS: Segmentation:  Standard. Alignment:  Normal. Vertebrae: There is an L4 vertebral body fracture with 70% maximal height loss centrally. There is complete replacement of normal bone marrow signal throughout the L4 vertebral body extending into the pedicles and facets bilaterally, however only mild edema type signal is present on STIR images. The right L4 pedicle appears expanded with small volume extraosseous tumor not excluded in this region, and there is bulging of the vertebral body cortex diffusely including posteriorly into the spinal canal with small volume epidural tumor not excluded. Two small T1 hypointense lesions are present posteriorly in the L3 vertebral body without associated fracture. A 2.5 cm lesion is partially visualized in the posterior right ilium. Conus medullaris and cauda equina: Conus extends to the upper L1 level. Conus and cauda equina appear normal. Paraspinal and other soft tissues: Unremarkable. Disc levels: Disc desiccation throughout the lumbar spine. T12-L1 through L2-3: Negative. L3-4: Disc bulging, L4 vertebral body bulging related to the fracture, congenitally short pedicles, and moderate facet hypertrophy result in severe spinal stenosis with asymmetric right lateral recess stenosis. No significant neural foraminal stenosis. L4-5: L4 vertebral body bulging related to the fracture, possible small volume epidural tumor, congenitally short pedicles, and moderate facet  and ligamentum flavum hypertrophy result in moderate spinal stenosis, asymmetric left lateral recess stenosis, and mild bilateral neural foraminal stenosis. L5-S1: Mild right and moderate left facet hypertrophy without disc herniation or stenosis. IMPRESSION: 1. Pathologic L4 vertebral body fracture with 70% height loss. Mild vertebral body retropulsion contributing to severe multifactorial spinal stenosis. Small volume extraosseous/epidural tumor not excluded. 2. Additional lesions in the L3 vertebral body and right ilium. Findings may reflect metastatic disease or multiple myeloma. These results will be called to the ordering clinician or representative by the Radiologist Assistant, and communication documented in the PACS or zVision Dashboard. Electronically Signed   By: Logan Bores M.D.   On: 10/29/2018 08:59   Nm Pet Image Initial (pi) Skull Base To Thigh  Result Date: 11/02/2018 CLINICAL DATA:  Initial treatment strategy for breast cancer. EXAM: NUCLEAR MEDICINE PET SKULL BASE TO THIGH TECHNIQUE: 8.79 mCi F-18 FDG was injected intravenously. Full-ring PET imaging was performed from the skull base to thigh after the radiotracer. CT data was obtained and used for attenuation correction and anatomic localization. Fasting blood glucose: 103 mg/dl COMPARISON:  None. FINDINGS: Mediastinal blood pool activity: SUV max 3.14 NECK: No hypermetabolic lymph nodes in the neck. Incidental CT findings: none CHEST: No hypermetabolic mediastinal or hilar nodes. No suspicious pulmonary nodules on the CT scan. Incidental CT findings: There is aortic atherosclerosis, as well as atherosclerosis of the great vessels of the mediastinum and the coronary arteries, including calcified atherosclerotic plaque in the left anterior descending and right coronary arteries. Densely calcified lesion in the right breast, no associated hypermetabolism, likely a scar at site of prior lumpectomy. ABDOMEN/PELVIS: No abnormal hypermetabolic  activity within the liver, pancreas, adrenal glands, or spleen. No hypermetabolic lymph nodes in the abdomen or pelvis. Incidental CT findings: Diffuse low attenuation throughout the hepatic parenchyma, indicative of hepatic steatosis. Aortic atherosclerosis. Several densely calcified lesions in the uterus, compatible with calcified fibroids. SKELETON: Lytic lesion in the posterior aspect of the L3 vertebral body measuring 1.6 cm (SUVmax = 7.0) . mixed lytic and sclerotic lesion infiltrating the entire L4 vertebral body (SUVmax = 10.1) . 1.4 cm lytic lesion in the posterior aspect of the  right ilium (axial image 180 of series 3) (SUVmax = 7.2) . 1.7 cm lytic lesion in the anterior aspect of the right ilium (axial image 198 of series 3) (SUVmax = 6.5) . Incidental CT findings: none IMPRESSION: 1. Multiple malignant osseous lesions, including the previously described pathologic compression fracture at L4, as discussed above, favored to reflect metastatic disease to the bones. Multiple myeloma is possible, however, given the mixed lytic and sclerotic lesion at the L4 pathologic compression fracture, this is not favored. 2. No definite primary lesion or extra skeletal metastatic disease identified in the neck, chest, abdomen or pelvis. 3. Aortic atherosclerosis, in addition to 2 vessel coronary artery disease. Please note that although the presence of coronary artery calcium documents the presence of coronary artery disease, the severity of this disease and any potential stenosis cannot be assessed on this non-gated CT examination. Assessment for potential risk factor modification, dietary therapy or pharmacologic therapy may be warranted, if clinically indicated. 4. Hepatic steatosis. Electronically Signed   By: Vinnie Langton M.D.   On: 11/02/2018 13:25    Labs:  CBC: Recent Labs    03/17/18 1050 11/01/18 1404 11/08/18 0826  WBC 5.1 6.6 5.8  HGB 14.2 14.7 14.5  HCT 42.0 45.0 43.5  PLT 179 201 190     COAGS: Recent Labs    11/08/18 0826  INR 0.92    BMP: Recent Labs    03/17/18 1050 08/13/18 0807 11/01/18 1404  NA 141 141 139  K 4.1 4.3 3.6  CL 103 103 103  CO2 _0 GLUCOSE 115* 109* 110*  BUN _1 CALCIUM 9.5 9.4 9.2  CREATININE 0.71 0.71 0.58  GFRNONAA 88 88 >60  GFRAA 101 101 >60    LIVER FUNCTION TESTS: Recent Labs    03/17/18 1050 08/13/18 0807 11/01/18 1404  BILITOT 0.3 0.3 0.6  AST 25 25 41  ALT 36* 41* 59*  ALKPHOS 70 72 101  PROT 6.9 6.5 7.4  ALBUMIN 4.5 4.2 4.3     Assessment and Plan:  By PET, safest lesion for CT guided biopsy is the posterior right iliac bone lesion. Will proceed with CT guided biopsy today. Risks and benefits discussed with the patient including, but not limited to bleeding, infection, damage to adjacent structures or low yield requiring additional tests. All of the patient's questions were answered, patient is agreeable to proceed. Consent signed and in chart.  Thank you for this interesting consult.  I greatly enjoyed meeting April Stuart and look forward to participating in their care.  A copy of this report was sent to the requesting provider on this date.  Electronically Signed: Azzie Roup, MD 11/08/2018, 8:58 AM     I spent a total of 30 Minutes in face to face in clinical consultation, greater than 50% of which was counseling/coordinating care for biopsy of a right iliac bone lesion.

## 2018-11-08 NOTE — Telephone Encounter (Signed)
done

## 2018-11-08 NOTE — Progress Notes (Signed)
oxycodone

## 2018-11-10 ENCOUNTER — Ambulatory Visit
Admission: RE | Admit: 2018-11-10 | Discharge: 2018-11-10 | Disposition: A | Discharge status: Home / Self Care | Payer: Medicare Other | Source: Ambulatory Visit | Attending: Radiation Oncology | Admitting: Radiation Oncology

## 2018-11-10 DIAGNOSIS — C7951 Secondary malignant neoplasm of bone: Secondary | ICD-10-CM | POA: Insufficient documentation

## 2018-11-10 DIAGNOSIS — M899 Disorder of bone, unspecified: Secondary | ICD-10-CM | POA: Diagnosis not present

## 2018-11-10 DIAGNOSIS — Z510 Encounter for antineoplastic radiation therapy: Secondary | ICD-10-CM | POA: Insufficient documentation

## 2018-11-11 ENCOUNTER — Telehealth: Payer: Self-pay | Admitting: *Deleted

## 2018-11-11 ENCOUNTER — Encounter: Payer: Self-pay | Admitting: Oncology

## 2018-11-11 ENCOUNTER — Inpatient Hospital Stay (HOSPITAL_BASED_OUTPATIENT_CLINIC_OR_DEPARTMENT_OTHER): Payer: Medicare Other | Admitting: Oncology

## 2018-11-11 VITALS — BP 115/72 | HR 112 | Temp 98.0°F | Resp 20 | Wt 167.9 lb

## 2018-11-11 DIAGNOSIS — Z510 Encounter for antineoplastic radiation therapy: Secondary | ICD-10-CM | POA: Diagnosis not present

## 2018-11-11 DIAGNOSIS — Z87891 Personal history of nicotine dependence: Secondary | ICD-10-CM | POA: Diagnosis not present

## 2018-11-11 DIAGNOSIS — C50919 Malignant neoplasm of unspecified site of unspecified female breast: Secondary | ICD-10-CM

## 2018-11-11 DIAGNOSIS — M899 Disorder of bone, unspecified: Secondary | ICD-10-CM | POA: Diagnosis not present

## 2018-11-11 DIAGNOSIS — Z853 Personal history of malignant neoplasm of breast: Secondary | ICD-10-CM

## 2018-11-11 DIAGNOSIS — Z7189 Other specified counseling: Secondary | ICD-10-CM

## 2018-11-11 DIAGNOSIS — C7951 Secondary malignant neoplasm of bone: Secondary | ICD-10-CM

## 2018-11-11 DIAGNOSIS — Z9221 Personal history of antineoplastic chemotherapy: Secondary | ICD-10-CM | POA: Diagnosis not present

## 2018-11-11 DIAGNOSIS — M8448XA Pathological fracture, other site, initial encounter for fracture: Secondary | ICD-10-CM | POA: Diagnosis not present

## 2018-11-11 DIAGNOSIS — Z79811 Long term (current) use of aromatase inhibitors: Secondary | ICD-10-CM | POA: Diagnosis not present

## 2018-11-11 NOTE — Telephone Encounter (Signed)
Patient called requesting to speak to [facility], Dr. Elroy Channel nurse, stating that she was calling with the information we requested. Patient would like a return call.     dhs

## 2018-11-11 NOTE — Progress Notes (Signed)
Patient states pain pill didn't help pain and she hallucinated from the 1 and only pill she took. Biopsy checked this am. Looks good minimal bruising. Pain is 6/10 when walking in her back. Ist radiation Tx will be jan 23, dry run on 22nd. No appetite  25% of normal. Bowels regular.

## 2018-11-12 ENCOUNTER — Other Ambulatory Visit: Payer: Self-pay | Admitting: *Deleted

## 2018-11-12 ENCOUNTER — Telehealth: Payer: Self-pay | Admitting: *Deleted

## 2018-11-12 ENCOUNTER — Encounter: Payer: Self-pay | Admitting: Oncology

## 2018-11-12 DIAGNOSIS — C7951 Secondary malignant neoplasm of bone: Secondary | ICD-10-CM

## 2018-11-12 DIAGNOSIS — C50919 Malignant neoplasm of unspecified site of unspecified female breast: Secondary | ICD-10-CM

## 2018-11-12 DIAGNOSIS — S32040A Wedge compression fracture of fourth lumbar vertebra, initial encounter for closed fracture: Secondary | ICD-10-CM

## 2018-11-12 DIAGNOSIS — Z7189 Other specified counseling: Secondary | ICD-10-CM | POA: Insufficient documentation

## 2018-11-12 DIAGNOSIS — Z853 Personal history of malignant neoplasm of breast: Secondary | ICD-10-CM

## 2018-11-12 NOTE — Telephone Encounter (Signed)
Called [facility] for Bilateral Diagnostic Mammogram appt. I called patient to give her appt details. Jan 24th Friday at 1:00PM. Patient states she can make that appt at [facility] center.

## 2018-11-12 NOTE — Progress Notes (Signed)
Hematology/Oncology Consult note [facility]  [phone] [phone]  Patient Care Team: Virginia Crews, MD as PCP - General (Family Medicine) Beverly Gust, MD (Otolaryngology)   Name of the patient: April Stuart  [id number]  10/24/1951   Date of visit: 11/12/18  Diagnosis- metastatic breast cancer with bone only mets  Chief complaint/ Reason for visit- discuss biopsy results and further management  Heme/Onc history: patient is a 68 year old female with a history of breast cancer back in 2008.  She is status post neoadjuvant chemotherapy lumpectomy adjuvant radiation and 5 years of hormone therapy.  I do not have the details of her breast cancer at this time since all her care was at [place].    We did obtain some partial records which suggested that patient had a 2.3 x 1.8 x 2.3 cm mass in her right breast which was invading the pectoralis muscle there were 2 satellite masses as well as worrisome lymph node seen on MRI before she received neoadjuvant chemotherapy.  Her final pathology showed residual tumor 2 separate foci 2 cm and 0.9 cm grade 2 with grade 1 respectively.  No lymph node involvement.  Presumably ER PR positive since patient received endocrine therapy for 5 years.   She otherwise has no significant comorbidities.  She reports falling off her treadmill about 4 to 5 months ago and since then she has been having ongoing back pain which particularly got worse around Thanksgiving.  She reports that her pain is relatively well controlled with ibuprofen when she is sitting in 1 place or laying down but she has uncontrolled pain at times when she ambulates.  Also notes bilateral leg spasms.  Denies any weakness or numbness in her extremities.  Denies any bowel or bladder incontinence.  Patient underwent lumbar spine x-ray in December 2019 which showed L4 compression fracture and was referred to Dr. Rudene Christians for further evaluation.  She  underwent MRI of her lumbar spine without contrast on 10/29/2017 which showed an L4 vertebral body fracture with 70% maximal height loss centrally.  Complete replacement of normal bone marrow signal throughout the L4 vertebral body extending into the pedicles and facets bilaterally.  The right L4 pedicle appears expanded with small volume extraosseous tumor not excluded in this region.  There is bulging of vertebral body cortex diffusely including posteriorly into the spinal canal with small volume epidural tumor not excluded.  2 hypointense lesions located in L3 as well as 2.5 cm lesion in the posterior right ilium.  Findings may reflect metastatic disease or multiple myeloma   PET CT scan showed multiple malignant osseous lesions including a pathological compression fracture at L4, lytic lesion involving L3 vertebral body and 1.7 cm lytic lesion in the anterior aspect of the right ilium.  There was no evidence for any primary malignancy elsewhere.  Patient decided not to pursue kyphoplasty and biopsy and instead went on to get CT-guided biopsy of the ilium which showed metastatic carcinoma compatible with breast primary.  ER PR and HER-2 is currently pending   Interval history-patient continues to have back pain but does not wish to take oxycodone.  She did take 1 dose and felt mild hallucinations and decided not to take any further narcotics.  She is scheduled to start radiation 1 week from now.  Other than back pain she feels well and denies other complaints today  ECOG PS- 1 Pain scale- 5 Opioid associated constipation- no  Review of systems- Review of Systems  Constitutional: Negative for chills, fever, malaise/fatigue and weight loss.  HENT: Negative for congestion, ear discharge and nosebleeds.   Eyes: Negative for blurred vision.  Respiratory: Negative for cough, hemoptysis, sputum production, shortness of breath and wheezing.   Cardiovascular: Negative for chest pain, palpitations, orthopnea  and claudication.  Gastrointestinal: Negative for abdominal pain, blood in stool, constipation, diarrhea, heartburn, melena, nausea and vomiting.  Genitourinary: Negative for dysuria, flank pain, frequency, hematuria and urgency.  Musculoskeletal: Positive for back pain. Negative for joint pain and myalgias.  Skin: Negative for rash.  Neurological: Negative for dizziness, tingling, focal weakness, seizures, weakness and headaches.  Endo/Heme/Allergies: Does not bruise/bleed easily.  Psychiatric/Behavioral: Negative for depression and suicidal ideas. The patient does not have insomnia.        Allergies  Allergen Reactions  . Penicillins Shortness Of Breath and Swelling    Facial swelling  . Sulfa Antibiotics Swelling    Facial swelling  . Atorvastatin Other (See Comments)    myalgias  . Rosuvastatin Calcium Other (See Comments)    Myalgias      Past Medical History:  Diagnosis Date  . Breast cancer ([place]) 2008   R breast  . Personal history of chemotherapy 03/2007   right breast ca  . Personal history of radiation therapy 2008   right breast ca  . Thyroid nodule 2017     Past Surgical History:  Procedure Laterality Date  . APPENDECTOMY  1958  . BARTHOLIN GLAND CYST EXCISION  1978  . BREAST BIOPSY Right 03/19/2007   carcinoma  . BREAST BIOPSY Right 08/10/2013   rt breast 3:00 coil clip benign per pt done in [place], 9:00 wing clip benign per pt done in [place] Right ?   ribbon marker benign  . BREAST EXCISIONAL BIOPSY Left 2017   pt states benign lymph nodes removed from axilla  . BREAST LUMPECTOMY Right 08/11/2007   with axillary dissection.   [place]   x [place]  . TUBAL LIGATION  1976    Social History   Socioeconomic History  . Marital status: Divorced    Spouse name: Not on file  . Number of children: 3  . Years of education: Not on file  . Highest education level: Master's degree (e.g., MA, MS,  MEng, MEd, MSW, MBA)  Occupational History  . 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 Financial resource strain: Not hard at all  . Food insecurity:    Worry: Never true    Inability: Never true  . Transportation needs:    Medical: No    Non-medical: No  Tobacco Use  . Smoking status: Former Smoker    Packs/day: 2.00    Years: 38.00    Pack years: 76.00    Types: Cigarettes    Last attempt to quit: 10/26/1997    Years since quitting: 21.0  . Smokeless tobacco: Never Used  Substance and Sexual Activity  . Alcohol use: Yes    Alcohol/week: 7.0 standard drinks    Types: 7 Glasses of wine per week  . Drug use: No  . Sexual activity: Never    Birth control/protection: Surgical  Lifestyle  . Physical activity:    Days per week: Not on file    Minutes per session: Not on file  . Stress: Not at all  Relationships  . Social connections:    Talks  on phone: Not on file    Gets together: Not on file    Attends religious service: Not on file    Active member of club or organization: Not on file    Attends meetings of clubs or organizations: Not on file    Relationship status: Not on file  . Intimate partner violence:    Fear of current or ex partner: Not on file    Emotionally abused: Not on file    Physically abused: Not on file    Forced sexual activity: Not on file  Other Topics Concern  . Not on file  Social History Narrative  . Not on file    Family History  Problem Relation Age of Onset  . Dementia Mother   . Stroke Mother 40  . Pancreatic cancer Father   . Prostate cancer Father   . HIV Brother   . Breast cancer Neg Hx   . Colon cancer Neg Hx      Current Outpatient Medications:  .  Biotin 5000 MCG CAPS, Take 5,000 mcg by mouth daily., Disp: , Rfl:  .  cholecalciferol (VITAMIN D) 1000 units tablet, Take 2,000 Units by mouth daily. , Disp: , Rfl:  .  cyclobenzaprine (FLEXERIL) 5 MG tablet, Take 1  tablet (5 mg total) by mouth 3 (three) times daily as needed for muscle spasms. (Patient not taking: Reported on 11/08/2018), Disp: 30 tablet, Rfl: 1 .  Multiple Vitamins-Minerals (CENTRUM ADULTS PO), Take by mouth., Disp: , Rfl:  .  oxyCODONE (OXY IR/ROXICODONE) 5 MG immediate release tablet, Take 1 tablet (5 mg total) by mouth every 6 (six) hours as needed for up to 10 days for severe pain., Disp: 30 tablet, Rfl: 0  Physical exam:  Vitals:   11/11/18 1124 11/11/18 1131  BP: 115/72   Pulse: (!) 112   Resp: 20   Temp: 98 F (36.7 C)   TempSrc: Tympanic   Weight:  167 lb 14.4 oz (76.2 kg)   Physical Exam Constitutional:      General: She is not in acute distress. HENT:     Head: Normocephalic and atraumatic.  Eyes:     Pupils: Pupils are equal, round, and reactive to light.  Neck:     Musculoskeletal: Normal range of motion.  Cardiovascular:     Rate and Rhythm: Normal rate and regular rhythm.     Heart sounds: Normal heart sounds.  Pulmonary:     Effort: Pulmonary effort is normal.     Breath sounds: Normal breath sounds.  Abdominal:     General: Bowel sounds are normal.     Palpations: Abdomen is soft.  Skin:    General: Skin is warm and dry.  Neurological:     General: No focal deficit present.     Mental Status: She is alert and oriented to person, place, and time.     Motor: No weakness.      CMP Latest Ref Rng & Units 11/01/2018  Glucose 70 - 99 mg/dL 110(H)  BUN 8 - 23 mg/dL 16  Creatinine 0.44 - 1.00 mg/dL 0.58  Sodium 135 - 145 mmol/L 139  Potassium 3.5 - 5.1 mmol/L 3.6  Chloride 98 - 111 mmol/L 103  CO2 22 - 32 mmol/L 24  Calcium 8.9 - 10.3 mg/dL 9.2  Total Protein 6.5 - 8.1 g/dL 7.4  Total Bilirubin 0.3 - 1.2 mg/dL 0.6  Alkaline Phos 38 - 126 U/L 101  AST 15 - 41 U/L 41  ALT  0 - 44 U/L 59(H)   CBC Latest Ref Rng & Units 11/08/2018  WBC 4.0 - 10.5 K/uL 5.8  Hemoglobin 12.0 - 15.0 g/dL 14.5  Hematocrit 36.0 - 46.0 % 43.5  Platelets 150 - 400 K/uL 190      No images are attached to the encounter.  Mr Lumbar Spine Wo Contrast  Result Date: 10/29/2018 CLINICAL DATA:  Right buttock and leg pain with numbness and tingling. [place] off of a treadmill landing on the right buttock 4-5 months ago. L4 compression fracture. History of breast cancer. EXAM: MRI LUMBAR SPINE WITHOUT CONTRAST TECHNIQUE: Multiplanar, multisequence MR imaging of the lumbar spine was performed. No intravenous contrast was administered. COMPARISON:  Lumbar spine radiographs 10/01/2018 FINDINGS: Segmentation:  Standard. Alignment:  Normal. Vertebrae: There is an L4 vertebral body fracture with 70% maximal height loss centrally. There is complete replacement of normal bone marrow signal throughout the L4 vertebral body extending into the pedicles and facets bilaterally, however only mild edema type signal is present on STIR images. The right L4 pedicle appears expanded with small volume extraosseous tumor not excluded in this region, and there is bulging of the vertebral body cortex diffusely including posteriorly into the spinal canal with small volume epidural tumor not excluded. Two small T1 hypointense lesions are present posteriorly in the L3 vertebral body without associated fracture. A 2.5 cm lesion is partially visualized in the posterior right ilium. Conus medullaris and cauda equina: Conus extends to the upper L1 level. Conus and cauda equina appear normal. Paraspinal and other soft tissues: Unremarkable. Disc levels: Disc desiccation throughout the lumbar spine. T12-L1 through L2-3: Negative. L3-4: Disc bulging, L4 vertebral body bulging related to the fracture, congenitally short pedicles, and moderate facet hypertrophy result in severe spinal stenosis with asymmetric right lateral recess stenosis. No significant neural foraminal stenosis. L4-5: L4 vertebral body bulging related to the fracture, possible small volume epidural tumor, congenitally short pedicles, and moderate facet and  ligamentum flavum hypertrophy result in moderate spinal stenosis, asymmetric left lateral recess stenosis, and mild bilateral neural foraminal stenosis. L5-S1: Mild right and moderate left facet hypertrophy without disc herniation or stenosis. IMPRESSION: 1. Pathologic L4 vertebral body fracture with 70% height loss. Mild vertebral body retropulsion contributing to severe multifactorial spinal stenosis. Small volume extraosseous/epidural tumor not excluded. 2. Additional lesions in the L3 vertebral body and right ilium. Findings may reflect metastatic disease or multiple myeloma. These results will be called to the ordering clinician or representative by the Radiologist Assistant, and communication documented in the PACS or zVision Dashboard. Electronically Signed   By: Logan Bores M.D.   On: 10/29/2018 08:59   Nm Pet Image Initial (pi) Skull Base To Thigh  Result Date: 11/02/2018 CLINICAL DATA:  Initial treatment strategy for breast cancer. EXAM: NUCLEAR MEDICINE PET SKULL BASE TO THIGH TECHNIQUE: 8.79 mCi F-18 FDG was injected intravenously. Full-ring PET imaging was performed from the skull base to thigh after the radiotracer. CT data was obtained and used for attenuation correction and anatomic localization. Fasting blood glucose: 103 mg/dl COMPARISON:  None. FINDINGS: Mediastinal blood pool activity: SUV max 3.14 NECK: No hypermetabolic lymph nodes in the neck. Incidental CT findings: none CHEST: No hypermetabolic mediastinal or hilar nodes. No suspicious pulmonary nodules on the CT scan. Incidental CT findings: There is aortic atherosclerosis, as well as atherosclerosis of the great vessels of the mediastinum and the coronary arteries, including calcified atherosclerotic plaque in the left anterior descending and right coronary arteries. Densely calcified lesion in  the right breast, no associated hypermetabolism, likely a scar at site of prior lumpectomy. ABDOMEN/PELVIS: No abnormal hypermetabolic activity  within the liver, pancreas, adrenal glands, or spleen. No hypermetabolic lymph nodes in the abdomen or pelvis. Incidental CT findings: Diffuse low attenuation throughout the hepatic parenchyma, indicative of hepatic steatosis. Aortic atherosclerosis. Several densely calcified lesions in the uterus, compatible with calcified fibroids. SKELETON: Lytic lesion in the posterior aspect of the L3 vertebral body measuring 1.6 cm (SUVmax = 7.0) . mixed lytic and sclerotic lesion infiltrating the entire L4 vertebral body (SUVmax = 10.1) . 1.4 cm lytic lesion in the posterior aspect of the right ilium (axial image 180 of series 3) (SUVmax = 7.2) . 1.7 cm lytic lesion in the anterior aspect of the right ilium (axial image 198 of series 3) (SUVmax = 6.5) . Incidental CT findings: none IMPRESSION: 1. Multiple malignant osseous lesions, including the previously described pathologic compression fracture at L4, as discussed above, favored to reflect metastatic disease to the bones. Multiple myeloma is possible, however, given the mixed lytic and sclerotic lesion at the L4 pathologic compression fracture, this is not favored. 2. No definite primary lesion or extra skeletal metastatic disease identified in the neck, chest, abdomen or pelvis. 3. Aortic atherosclerosis, in addition to 2 vessel coronary artery disease. Please note that although the presence of coronary artery calcium documents the presence of coronary artery disease, the severity of this disease and any potential stenosis cannot be assessed on this non-gated CT examination. Assessment for potential risk factor modification, dietary therapy or pharmacologic therapy may be warranted, if clinically indicated. 4. Hepatic steatosis. Electronically Signed   By: Vinnie Langton M.D.   On: 11/02/2018 13:25   Ct Biopsy  Result Date: 11/08/2018 CLINICAL DATA:  History of breast carcinoma in 2008 with imaging evidence of bone lesions at L3, L4 and in the right iliac bone. The  patient presents for biopsy of the right posterior iliac bone lesion. EXAM: CT GUIDED CORE BIOPSY OF RIGHT ILIAC PELVIC BONE LESION ANESTHESIA/SEDATION: Versed 2.5 mg IV, Fentanyl 75 mcg IV Total Moderate Sedation Time:   16 minutes. The patient's level of consciousness and physiologic status were continuously monitored during the procedure by Radiology nursing. PROCEDURE: The procedure risks, benefits, and alternatives were explained to the patient. Questions regarding the procedure were encouraged and answered. The patient understands and consents to the procedure. A time out was performed prior to initiating the procedure. Imaging was performed through the bony pelvis in a prone position. The right gluteal region was prepped with chlorhexidine. Sterile gown and sterile gloves were used for the procedure. Local anesthesia was provided with 1% Lidocaine. Under CT guidance, an 11 gauge On Control bone cutting needle was advanced from a posterior approach into the right iliac bone. Needle positioning was confirmed with CT. Core biopsy was performed via the On Control drill needle. COMPLICATIONS: None FINDINGS: Corresponding to the PET positive right posterior iliac bone lesion, a largely lytic lesion is seen in the posterior right iliac bone measuring up to approximately 1.8 cm in estimated diameter. An intact core biopsy sample was obtained through the lesion. IMPRESSION: CT guided core biopsy of right posterior iliac bone lesion. Electronically Signed   By: Aletta Edouard M.D.   On: 11/08/2018 10:28     Assessment and plan- Patient is a 68 y.o. female with history of breast cancer in 2008 here to discuss the results of PET CT scan and CT-guided biopsy  I have reviewed PET/CT scan  images independently and discussed findings with the patient.  PET CT scan reveals involvement of the L3 and L4 vertebral body as well as the right ilium bone. No evidence of primary elsewhere was found.  Biopsy at the time of my  visit with Ms. uplinger only had a prelim result of metastatic carcinoma.  After patient had left for the day results came back as breast primary.  I will discuss these results with her next week and hopefully by then we will also have ER PR and HER-2 testing results.  I also checked with pathology and we do not have sufficient tissue for sending Omniseq testing at this point.   Patient will first proceed for her palliative radiation next week and upon completion of radiation I will plan to start systemic treatment which will either be chemotherapy or Ibrance if she has ER positive disease.  Treatment will be given with palliative intent.  I will discuss the role of bisphosphonates at next visit  Based on clinical exam today I do not see any suspicious masses in bilateral breasts.  I will proceed with a bilateral mammogram at this time.  Patient did have a mammogram in October which did not reveal any malignancy  Visit Diagnosis 1. Goals of care, counseling/discussion   2. Metastatic breast cancer ([place])   3. Bone metastases ([place])      Dr. Randa Evens, MD, MPH [facility] [phone] 11/12/2018 10:36 AM

## 2018-11-15 LAB — SURGICAL PATHOLOGY

## 2018-11-17 ENCOUNTER — Ambulatory Visit
Admission: RE | Admit: 2018-11-17 | Discharge: 2018-11-17 | Disposition: A | Discharge status: Home / Self Care | Payer: Medicare Other | Source: Ambulatory Visit | Attending: Radiation Oncology | Admitting: Radiation Oncology

## 2018-11-17 DIAGNOSIS — Z510 Encounter for antineoplastic radiation therapy: Secondary | ICD-10-CM | POA: Diagnosis not present

## 2018-11-17 DIAGNOSIS — C7951 Secondary malignant neoplasm of bone: Secondary | ICD-10-CM | POA: Diagnosis not present

## 2018-11-17 DIAGNOSIS — M899 Disorder of bone, unspecified: Secondary | ICD-10-CM | POA: Diagnosis not present

## 2018-11-18 ENCOUNTER — Telehealth: Payer: Self-pay | Admitting: Pharmacist

## 2018-11-18 ENCOUNTER — Ambulatory Visit
Admission: RE | Admit: 2018-11-18 | Discharge: 2018-11-18 | Disposition: A | Discharge status: Home / Self Care | Payer: Medicare Other | Source: Ambulatory Visit | Attending: Radiation Oncology | Admitting: Radiation Oncology

## 2018-11-18 ENCOUNTER — Telehealth: Payer: Self-pay | Admitting: Pharmacy Technician

## 2018-11-18 ENCOUNTER — Inpatient Hospital Stay (HOSPITAL_BASED_OUTPATIENT_CLINIC_OR_DEPARTMENT_OTHER): Payer: Medicare Other | Admitting: Oncology

## 2018-11-18 ENCOUNTER — Encounter: Payer: Self-pay | Admitting: Oncology

## 2018-11-18 VITALS — BP 162/88 | HR 105 | Temp 97.3°F | Resp 17 | Wt 166.3 lb

## 2018-11-18 DIAGNOSIS — Z79811 Long term (current) use of aromatase inhibitors: Secondary | ICD-10-CM

## 2018-11-18 DIAGNOSIS — Z510 Encounter for antineoplastic radiation therapy: Secondary | ICD-10-CM | POA: Diagnosis not present

## 2018-11-18 DIAGNOSIS — Z87891 Personal history of nicotine dependence: Secondary | ICD-10-CM | POA: Diagnosis not present

## 2018-11-18 DIAGNOSIS — C50919 Malignant neoplasm of unspecified site of unspecified female breast: Secondary | ICD-10-CM | POA: Diagnosis not present

## 2018-11-18 DIAGNOSIS — Z9221 Personal history of antineoplastic chemotherapy: Secondary | ICD-10-CM | POA: Diagnosis not present

## 2018-11-18 DIAGNOSIS — Z7189 Other specified counseling: Secondary | ICD-10-CM

## 2018-11-18 DIAGNOSIS — M8448XA Pathological fracture, other site, initial encounter for fracture: Secondary | ICD-10-CM | POA: Diagnosis not present

## 2018-11-18 DIAGNOSIS — C7951 Secondary malignant neoplasm of bone: Secondary | ICD-10-CM | POA: Diagnosis not present

## 2018-11-18 MED ORDER — PALBOCICLIB 125 MG PO CAPS: 125.0000 mg | ORAL_CAPSULE | Freq: Every day | ORAL | 1 refills | Status: DC

## 2018-11-18 NOTE — Telephone Encounter (Signed)
Oral Oncology Pharmacist Encounter  Received new prescription for Ibrance (palbociclib) for the treatment of metastatic breast cancer in conjunction with letrozole, planned duration until disease progression or unacceptable drug toxicity.  CBC from 11/08/2018 and CMP from 11/01/2018 assessed, no relevant lab abnormalities. Prescription dose and frequency assessed.   Current medication list in Epic reviewed,  No relevant DDIs with Ibrance identified.  Prescription has been e-scribed to the [facility] for benefits analysis and approval.  Patient education Counseled patient following her OV on administration, dosing, side effects, monitoring, drug-food interactions, safe handling, storage, and disposal. Patient will take 1 capsule (125 mg total) by mouth daily. Take whole with food. Take for 21 days on, 7 days off, repeat every 28 days.  Side effects include but not limited to: decreased wbc/hgb/plt, N/V, fatigue.    Reviewed with patient importance of keeping a medication schedule and plan for any missed doses.  Ms. He voiced understanding and appreciation. All questions answered. Medication handout provided and consent obtained.  Oral Oncology Clinic will continue to follow for insurance authorization, copayment issues, and start date.  Provided patient with [facility] [place] [facility] phone number. Patient knows to call the office with questions or concerns. Oral Chemotherapy Navigation Clinic will continue to follow.  Darl Pikes, PharmD, BCPS, [facility] Hematology/Oncology Clinical Pharmacist [facility] [place] [facility] [phone]  11/18/2018 12:19 PM

## 2018-11-18 NOTE — Progress Notes (Signed)
Patient here to discuss Plan of care for her metastatic breast cancer to spine. Continues to have back pain. Hoping radiation will reduce this pain for her.

## 2018-11-19 ENCOUNTER — Ambulatory Visit: Payer: Medicare Other

## 2018-11-19 ENCOUNTER — Telehealth: Payer: Self-pay | Admitting: Pharmacy Technician

## 2018-11-19 ENCOUNTER — Ambulatory Visit
Admission: RE | Admit: 2018-11-19 | Discharge: 2018-11-19 | Disposition: A | Discharge status: Home / Self Care | Payer: Medicare Other | Source: Ambulatory Visit | Attending: Oncology | Admitting: Oncology

## 2018-11-19 ENCOUNTER — Ambulatory Visit
Admission: RE | Admit: 2018-11-19 | Discharge: 2018-11-19 | Disposition: A | Discharge status: Home / Self Care | Payer: Medicare Other | Source: Ambulatory Visit | Attending: Radiation Oncology | Admitting: Radiation Oncology

## 2018-11-19 DIAGNOSIS — C50919 Malignant neoplasm of unspecified site of unspecified female breast: Secondary | ICD-10-CM | POA: Diagnosis not present

## 2018-11-19 DIAGNOSIS — Z853 Personal history of malignant neoplasm of breast: Secondary | ICD-10-CM | POA: Diagnosis not present

## 2018-11-19 DIAGNOSIS — R922 Inconclusive mammogram: Secondary | ICD-10-CM | POA: Diagnosis not present

## 2018-11-19 DIAGNOSIS — Z510 Encounter for antineoplastic radiation therapy: Secondary | ICD-10-CM | POA: Diagnosis not present

## 2018-11-19 DIAGNOSIS — S32040A Wedge compression fracture of fourth lumbar vertebra, initial encounter for closed fracture: Secondary | ICD-10-CM | POA: Insufficient documentation

## 2018-11-19 DIAGNOSIS — C7951 Secondary malignant neoplasm of bone: Secondary | ICD-10-CM | POA: Diagnosis not present

## 2018-11-19 NOTE — Telephone Encounter (Signed)
Oral Oncology Patient Advocate Encounter   Received notification from [place] that prior authorization for Ibrance 125mg  is required.   PA submitted on CoverMyMeds Key [id number] Status is pending    Oral Oncology Clinic will continue to follow.  Bartholomew Crews, CPhT Specialty Pharmacy Patient [facility] for [facility] Phone: [phone] Fax: [phone] 11/19/2018 8:46 AM

## 2018-11-19 NOTE — Telephone Encounter (Signed)
Oral Oncology Patient Advocate Encounter  Prior Authorization for Ibrance 125mg  has been approved.    PA# [id number]  Effective dates: 08/21/18 through 11/18/2021  Patients co-pay is $2835.05  Oral Oncology Clinic will continue to follow.   Bartholomew Crews, CPhT Specialty Pharmacy Patient [facility] for Infectious Disease Phone: [phone] Fax: [phone] 11/19/2018 9:31 AM

## 2018-11-19 NOTE — Progress Notes (Signed)
Hematology/Oncology Consult note [facility]  Telephone:([phone] [phone]  Patient Care Team: Virginia Crews, MD as PCP - General (Family Medicine) Beverly Gust, MD (Otolaryngology)   Name of the patient: April Stuart  [id number]  12-03-1950   Date of visit: 11/19/18  Diagnosis- metastatic breast cancer with bone only mets  Chief complaint/ Reason for visit-discuss pathology results and further management  Heme/Onc history: patient is a 68 year old female with a history of breast cancer back in 2008. She is status post neoadjuvant chemotherapy lumpectomy adjuvant radiation and 5 years of hormone therapy. I do not have the details of her breast cancer at this time since all her care was at [place].   We did obtain some partial records which suggested that patient had a 2.3 x 1.8 x 2.3 cm mass in her right breast which was invading the pectoralis muscle there were 2 satellite masses as well as worrisome lymph node seen on MRI before she received neoadjuvant chemotherapy.  Her final pathology showed residual tumor 2 separate foci 2 cm and 0.9 cm grade 2 with grade 1 respectively.  No lymph node involvement.  Presumably ER PR positive since patient received endocrine therapy for 5 years.  She otherwise has no significant comorbidities. She reports falling off her treadmill about 4 to 5 months ago and since then she has been having ongoing back pain which particularly got worse around Thanksgiving. She reports that her pain is relatively well controlled with ibuprofen when she is sitting in 1 place or laying down but she has uncontrolled pain at times when she ambulates. Also notes bilateral leg spasms. Denies any weakness or numbness in her extremities. Denies any bowel or bladder incontinence.  Patient underwent lumbar spine x-ray in December 2019 which showed L4 compression fracture and was referred to Dr. Rudene Christians for further evaluation.  She underwent MRI of her lumbar spine without contrast on 10/29/2017 which showed an L4 vertebral body fracture with 70% maximal height loss centrally. Complete replacement of normal bone marrow signal throughout the L4 vertebral body extending into the pedicles and facets bilaterally. The right L4 pedicle appears expanded with small volume extraosseous tumor not excluded in this region. There is bulging of vertebral body cortex diffusely including posteriorly into the spinal canal with small volume epidural tumor not excluded. 2 hypointense lesions located in L3 as well as 2.5 cm lesion in the posterior right ilium. Findings may reflect metastatic disease or multiple myeloma  PET CT scan showed multiple malignant osseous lesions including a pathological compression fracture at L4, lytic lesion involving L3 vertebral body and 1.7 cm lytic lesion in the anterior aspect of the right ilium.  There was no evidence for any primary malignancy elsewhere.  Patient decided not to pursue kyphoplasty and biopsy and instead went on to get CT-guided biopsy of the ilium which showed metastatic carcinoma compatible with breast primary.  ER 51 TO 90% POSITIVE, PR 11 to 50% positive and HER-2 negative   Interval history-reports that her back pain is okay at rest but mainly comes on when she walks.  She still able to continue her ADLs but is not able to stand for long periods of time.   ECOG PS- 1 Pain scale- 4 Opioid associated constipation- no  Review of systems- Review of Systems  Constitutional: Negative for chills, fever, malaise/fatigue and weight loss.  HENT: Negative for congestion, ear discharge and nosebleeds.   Eyes: Negative for blurred vision.  Respiratory: Negative for  cough, hemoptysis, sputum production, shortness of breath and wheezing.   Cardiovascular: Negative for chest pain, palpitations, orthopnea and claudication.  Gastrointestinal: Negative for abdominal pain, blood in stool,  constipation, diarrhea, heartburn, melena, nausea and vomiting.  Genitourinary: Negative for dysuria, flank pain, frequency, hematuria and urgency.  Musculoskeletal: Positive for back pain. Negative for joint pain and myalgias.  Skin: Negative for rash.  Neurological: Negative for dizziness, tingling, focal weakness, seizures, weakness and headaches.  Endo/Heme/Allergies: Does not bruise/bleed easily.  Psychiatric/Behavioral: Negative for depression and suicidal ideas. The patient does not have insomnia.        Allergies  Allergen Reactions  . Penicillins Shortness Of Breath and Swelling    Facial swelling  . Sulfa Antibiotics Swelling    Facial swelling  . Atorvastatin Other (See Comments)    myalgias  . Rosuvastatin Calcium Other (See Comments)    Myalgias      Past Medical History:  Diagnosis Date  . Breast cancer ([place]) 2008   R breast  . Personal history of chemotherapy 03/2007   right breast ca  . Personal history of radiation therapy 2008   right breast ca  . Thyroid nodule 2017     Past Surgical History:  Procedure Laterality Date  . APPENDECTOMY  1958  . BARTHOLIN GLAND CYST EXCISION  1978  . BREAST BIOPSY Right 03/19/2007   carcinoma  . BREAST BIOPSY Right 08/10/2013   rt breast 3:00 coil clip benign per pt done in [place], 9:00 wing clip benign per pt done in [place] Right ?   ribbon marker benign  . BREAST EXCISIONAL BIOPSY Left 2017   pt states benign lymph nodes removed from axilla  . BREAST LUMPECTOMY Right 08/11/2007   with axillary dissection.   Marland Kitchen [place]   x [place]  . TUBAL LIGATION  1976    Social History   Socioeconomic History  . Marital status: Divorced    Spouse name: Not on file  . Number of children: 3  . Years of education: Not on file  . Highest education level: Master's degree (e.g., MA, MS, MEng, MEd, MSW, MBA)  Occupational History  . Occupation: retired    Comment:  Armed forces technical officer with [employer]  Social Needs  . Financial resource strain: Not hard at all  . Food insecurity:    Worry: Never true    Inability: Never true  . Transportation needs:    Medical: No    Non-medical: No  Tobacco Use  . Smoking status: Former Smoker    Packs/day: 2.00    Years: 38.00    Pack years: 76.00    Types: Cigarettes    Last attempt to quit: 10/26/1997    Years since quitting: 21.0  . Smokeless tobacco: Never Used  Substance and Sexual Activity  . Alcohol use: Yes    Alcohol/week: 7.0 standard drinks    Types: 7 Glasses of wine per week  . Drug use: No  . Sexual activity: Never    Birth control/protection: Surgical  Lifestyle  . Physical activity:    Days per week: Not on file    Minutes per session: Not on file  . Stress: Not at all  Relationships  . Social connections:    Talks on phone: Not on file    Gets together: Not on file    Attends religious service: Not on file    Active member of club  or organization: Not on file    Attends meetings of clubs or organizations: Not on file    Relationship status: Not on file  . Intimate partner violence:    Fear of current or ex partner: Not on file    Emotionally abused: Not on file    Physically abused: Not on file    Forced sexual activity: Not on file  Other Topics Concern  . Not on file  Social History Narrative  . Not on file    Family History  Problem Relation Age of Onset  . Dementia Mother   . Stroke Mother 66  . Pancreatic cancer Father   . Prostate cancer Father   . HIV Brother   . Breast cancer Neg Hx   . Colon cancer Neg Hx      Current Outpatient Medications:  .  Biotin 5000 MCG CAPS, Take 5,000 mcg by mouth daily., Disp: , Rfl:  .  cholecalciferol (VITAMIN D) 1000 units tablet, Take 2,000 Units by mouth daily. , Disp: , Rfl:  .  cyclobenzaprine (FLEXERIL) 5 MG tablet, Take 1 tablet (5 mg total) by mouth 3 (three) times daily as needed for muscle spasms.,  Disp: 30 tablet, Rfl: 1 .  Multiple Vitamins-Minerals (CENTRUM ADULTS PO), Take by mouth., Disp: , Rfl:  .  palbociclib (IBRANCE) 125 MG capsule, Take 1 capsule (125 mg total) by mouth daily. Take whole with food. Take for 21 days on, 7 days off, repeat every 28 days., Disp: 21 capsule, Rfl: 1  Physical exam:  Vitals:   11/18/18 1116  BP: (!) 162/88  Pulse: (!) 105  Resp: 17  Temp: (!) 97.3 F (36.3 C)  TempSrc: Tympanic  Weight: 166 lb 4.8 oz (75.4 kg)   Physical Exam Constitutional:      General: She is not in acute distress. HENT:     Head: Normocephalic and atraumatic.  Eyes:     Pupils: Pupils are equal, round, and reactive to light.  Neck:     Musculoskeletal: Normal range of motion.  Cardiovascular:     Rate and Rhythm: Normal rate and regular rhythm.     Heart sounds: Normal heart sounds.  Pulmonary:     Effort: Pulmonary effort is normal.     Breath sounds: Normal breath sounds.  Abdominal:     General: Bowel sounds are normal.     Palpations: Abdomen is soft.  Skin:    General: Skin is warm and dry.  Neurological:     Mental Status: She is alert and oriented to person, place, and time.      CMP Latest Ref Rng & Units 11/01/2018  Glucose 70 - 99 mg/dL 110(H)  BUN 8 - 23 mg/dL 16  Creatinine 0.44 - 1.00 mg/dL 0.58  Sodium 135 - 145 mmol/L 139  Potassium 3.5 - 5.1 mmol/L 3.6  Chloride 98 - 111 mmol/L 103  CO2 22 - 32 mmol/L 24  Calcium 8.9 - 10.3 mg/dL 9.2  Total Protein 6.5 - 8.1 g/dL 7.4  Total Bilirubin 0.3 - 1.2 mg/dL 0.6  Alkaline Phos 38 - 126 U/L 101  AST 15 - 41 U/L 41  ALT 0 - 44 U/L 59(H)   CBC Latest Ref Rng & Units 11/08/2018  WBC 4.0 - 10.5 K/uL 5.8  Hemoglobin 12.0 - 15.0 g/dL 14.5  Hematocrit 36.0 - 46.0 % 43.5  Platelets 150 - 400 K/uL 190    No images are attached to the encounter.  Mr Lumbar Spine Wo  Contrast  Result Date: 10/29/2018 CLINICAL DATA:  Right buttock and leg pain with numbness and tingling. [place] off of a treadmill  landing on the right buttock 4-5 months ago. L4 compression fracture. History of breast cancer. EXAM: MRI LUMBAR SPINE WITHOUT CONTRAST TECHNIQUE: Multiplanar, multisequence MR imaging of the lumbar spine was performed. No intravenous contrast was administered. COMPARISON:  Lumbar spine radiographs 10/01/2018 FINDINGS: Segmentation:  Standard. Alignment:  Normal. Vertebrae: There is an L4 vertebral body fracture with 70% maximal height loss centrally. There is complete replacement of normal bone marrow signal throughout the L4 vertebral body extending into the pedicles and facets bilaterally, however only mild edema type signal is present on STIR images. The right L4 pedicle appears expanded with small volume extraosseous tumor not excluded in this region, and there is bulging of the vertebral body cortex diffusely including posteriorly into the spinal canal with small volume epidural tumor not excluded. Two small T1 hypointense lesions are present posteriorly in the L3 vertebral body without associated fracture. A 2.5 cm lesion is partially visualized in the posterior right ilium. Conus medullaris and cauda equina: Conus extends to the upper L1 level. Conus and cauda equina appear normal. Paraspinal and other soft tissues: Unremarkable. Disc levels: Disc desiccation throughout the lumbar spine. T12-L1 through L2-3: Negative. L3-4: Disc bulging, L4 vertebral body bulging related to the fracture, congenitally short pedicles, and moderate facet hypertrophy result in severe spinal stenosis with asymmetric right lateral recess stenosis. No significant neural foraminal stenosis. L4-5: L4 vertebral body bulging related to the fracture, possible small volume epidural tumor, congenitally short pedicles, and moderate facet and ligamentum flavum hypertrophy result in moderate spinal stenosis, asymmetric left lateral recess stenosis, and mild bilateral neural foraminal stenosis. L5-S1: Mild right and moderate left facet  hypertrophy without disc herniation or stenosis. IMPRESSION: 1. Pathologic L4 vertebral body fracture with 70% height loss. Mild vertebral body retropulsion contributing to severe multifactorial spinal stenosis. Small volume extraosseous/epidural tumor not excluded. 2. Additional lesions in the L3 vertebral body and right ilium. Findings may reflect metastatic disease or multiple myeloma. These results will be called to the ordering clinician or representative by the Radiologist Assistant, and communication documented in the PACS or zVision Dashboard. Electronically Signed   By: Logan Bores M.D.   On: 10/29/2018 08:59   Nm Pet Image Initial (pi) Skull Base To Thigh  Result Date: 11/02/2018 CLINICAL DATA:  Initial treatment strategy for breast cancer. EXAM: NUCLEAR MEDICINE PET SKULL BASE TO THIGH TECHNIQUE: 8.79 mCi F-18 FDG was injected intravenously. Full-ring PET imaging was performed from the skull base to thigh after the radiotracer. CT data was obtained and used for attenuation correction and anatomic localization. Fasting blood glucose: 103 mg/dl COMPARISON:  None. FINDINGS: Mediastinal blood pool activity: SUV max 3.14 NECK: No hypermetabolic lymph nodes in the neck. Incidental CT findings: none CHEST: No hypermetabolic mediastinal or hilar nodes. No suspicious pulmonary nodules on the CT scan. Incidental CT findings: There is aortic atherosclerosis, as well as atherosclerosis of the great vessels of the mediastinum and the coronary arteries, including calcified atherosclerotic plaque in the left anterior descending and right coronary arteries. Densely calcified lesion in the right breast, no associated hypermetabolism, likely a scar at site of prior lumpectomy. ABDOMEN/PELVIS: No abnormal hypermetabolic activity within the liver, pancreas, adrenal glands, or spleen. No hypermetabolic lymph nodes in the abdomen or pelvis. Incidental CT findings: Diffuse low attenuation throughout the hepatic parenchyma,  indicative of hepatic steatosis. Aortic atherosclerosis. Several densely calcified lesions in  the uterus, compatible with calcified fibroids. SKELETON: Lytic lesion in the posterior aspect of the L3 vertebral body measuring 1.6 cm (SUVmax = 7.0) . mixed lytic and sclerotic lesion infiltrating the entire L4 vertebral body (SUVmax = 10.1) . 1.4 cm lytic lesion in the posterior aspect of the right ilium (axial image 180 of series 3) (SUVmax = 7.2) . 1.7 cm lytic lesion in the anterior aspect of the right ilium (axial image 198 of series 3) (SUVmax = 6.5) . Incidental CT findings: none IMPRESSION: 1. Multiple malignant osseous lesions, including the previously described pathologic compression fracture at L4, as discussed above, favored to reflect metastatic disease to the bones. Multiple myeloma is possible, however, given the mixed lytic and sclerotic lesion at the L4 pathologic compression fracture, this is not favored. 2. No definite primary lesion or extra skeletal metastatic disease identified in the neck, chest, abdomen or pelvis. 3. Aortic atherosclerosis, in addition to 2 vessel coronary artery disease. Please note that although the presence of coronary artery calcium documents the presence of coronary artery disease, the severity of this disease and any potential stenosis cannot be assessed on this non-gated CT examination. Assessment for potential risk factor modification, dietary therapy or pharmacologic therapy may be warranted, if clinically indicated. 4. Hepatic steatosis. Electronically Signed   By: Vinnie Langton M.D.   On: 11/02/2018 13:25   Ct Biopsy  Result Date: 11/08/2018 CLINICAL DATA:  History of breast carcinoma in 2008 with imaging evidence of bone lesions at L3, L4 and in the right iliac bone. The patient presents for biopsy of the right posterior iliac bone lesion. EXAM: CT GUIDED CORE BIOPSY OF RIGHT ILIAC PELVIC BONE LESION ANESTHESIA/SEDATION: Versed 2.5 mg IV, Fentanyl 75 mcg IV  Total Moderate Sedation Time:   16 minutes. The patient's level of consciousness and physiologic status were continuously monitored during the procedure by Radiology nursing. PROCEDURE: The procedure risks, benefits, and alternatives were explained to the patient. Questions regarding the procedure were encouraged and answered. The patient understands and consents to the procedure. A time out was performed prior to initiating the procedure. Imaging was performed through the bony pelvis in a prone position. The right gluteal region was prepped with chlorhexidine. Sterile gown and sterile gloves were used for the procedure. Local anesthesia was provided with 1% Lidocaine. Under CT guidance, an 11 gauge On Control bone cutting needle was advanced from a posterior approach into the right iliac bone. Needle positioning was confirmed with CT. Core biopsy was performed via the On Control drill needle. COMPLICATIONS: None FINDINGS: Corresponding to the PET positive right posterior iliac bone lesion, a largely lytic lesion is seen in the posterior right iliac bone measuring up to approximately 1.8 cm in estimated diameter. An intact core biopsy sample was obtained through the lesion. IMPRESSION: CT guided core biopsy of right posterior iliac bone lesion. Electronically Signed   By: Aletta Edouard M.D.   On: 11/08/2018 10:28     Assessment and plan- Patient is a 68 y.o. female with history of breast cancer in 2008 here to discuss the results of final pathology  CT-guided bone biopsy is consistent with metastatic carcinoma of mammary primary.  Tumor was ER 51 to 90% positive, PR 11 to 50% positive and HER-2/neu negative.  She has only bone metastases mainly involving the L3-L4 and the iliac bone.  Unfortunately there was not enough sample available for omniseq testing to look for any other targetable mutations.  I discussed the following options with her  1.  Initiate letrozole now and start Ibrance 125 mg 3 weeks on 1  week off after the completion of radiation on 12/01/2018.  Hold off on kyphoplasty and biopsy now for NGS testing  2.  Initiate letrozole and Ibrance as above but proceed with kyphoplasty and biopsy at this time to obtain NGS testing  3.  Continue letrozole and Ibrance and consider second biopsy at the time of progression.  Patient plans to move to [facility] and ultimately seek care at [facility] in 2 months time.  She would like to hold off on second biopsy at this time.  I will therefore start her on letrozole today get working on Biochemist, clinical for [employer] which she will start upon completion of radiation treatment.  Discussed risks and benefits of Ibrance including all but not limited to fatigue, diarrhea, skin rash, low blood counts including pancytopenia.  Patient understands and agrees to proceed as planned.  Treatment will be given with a palliative intent.  There would be no role for a double mastectomy which the patient was asking given that she has stage IV breast cancer with bone metastases.  We also discussed the role for Zometa or Xgeva every month given her bone metastases.  Discussed risks and benefits of Zometa including all but not limited to fatigue, hypocalcemia and risk of osteonecrosis of the jaw.  Patient is yet to find a dentist here but she will find one soon and let us know who it is so we can get her clearance for Zometa and started at the earliest.  I will also look for finding a breast oncologist at [facility] to initiate the process of referral in 2 months time  Check CBC and CMP on 2/small scalp 5 prior to starting Ibrance.  I will see her back on 2/13 with CBC and CMP   Total face to face encounter time for this patient visit was 40 min. >50% of the time was  spent in counseling and coordination of care.   Visit Diagnosis 1. Metastatic breast cancer ([place])   2. Bone metastases ([place])   3. Goals of care, counseling/discussion      Dr. Randa Evens, MD, MPH [facility]  at [facility] [phone] 11/19/2018 12:03 PM

## 2018-11-22 ENCOUNTER — Ambulatory Visit
Admission: RE | Admit: 2018-11-22 | Discharge: 2018-11-22 | Disposition: A | Discharge status: Home / Self Care | Payer: Medicare Other | Source: Ambulatory Visit | Attending: Radiation Oncology | Admitting: Radiation Oncology

## 2018-11-22 ENCOUNTER — Other Ambulatory Visit: Payer: Self-pay | Admitting: Pharmacist

## 2018-11-22 DIAGNOSIS — C50919 Malignant neoplasm of unspecified site of unspecified female breast: Secondary | ICD-10-CM

## 2018-11-22 DIAGNOSIS — C7951 Secondary malignant neoplasm of bone: Secondary | ICD-10-CM | POA: Diagnosis not present

## 2018-11-22 DIAGNOSIS — Z510 Encounter for antineoplastic radiation therapy: Secondary | ICD-10-CM | POA: Diagnosis not present

## 2018-11-22 MED ORDER — LETROZOLE 2.5 MG PO TABS: 2.5000 mg | ORAL_TABLET | Freq: Every day | ORAL | 2 refills | Status: DC

## 2018-11-23 ENCOUNTER — Ambulatory Visit
Admission: RE | Admit: 2018-11-23 | Discharge: 2018-11-23 | Disposition: A | Discharge status: Home / Self Care | Payer: Medicare Other | Source: Ambulatory Visit | Attending: Radiation Oncology | Admitting: Radiation Oncology

## 2018-11-23 ENCOUNTER — Other Ambulatory Visit: Payer: Self-pay | Admitting: *Deleted

## 2018-11-23 ENCOUNTER — Telehealth: Payer: Self-pay | Admitting: Pharmacist

## 2018-11-23 DIAGNOSIS — Z510 Encounter for antineoplastic radiation therapy: Secondary | ICD-10-CM | POA: Diagnosis not present

## 2018-11-23 DIAGNOSIS — C7951 Secondary malignant neoplasm of bone: Secondary | ICD-10-CM | POA: Diagnosis not present

## 2018-11-23 MED ORDER — LANSOPRAZOLE 30 MG PO CPDR: 30.0000 mg | DELAYED_RELEASE_CAPSULE | Freq: Every day | ORAL | 3 refills | Status: AC

## 2018-11-23 MED ORDER — ONDANSETRON HCL 8 MG PO TABS: 8.0000 mg | ORAL_TABLET | Freq: Three times a day (TID) | ORAL | 0 refills | Status: AC | PRN

## 2018-11-23 NOTE — Telephone Encounter (Signed)
Oral Chemotherapy Pharmacist Encounter   Met with patient following her radiation oncology appt. Provided her with an [place] medication calendar and [facility] started kit.   Darl Pikes, PharmD, BCPS, [facility] Hematology/Oncology Clinical Pharmacist [facility]/AP Oral [facility] [phone]  11/23/2018 1:50 PM

## 2018-11-24 ENCOUNTER — Ambulatory Visit
Admission: RE | Admit: 2018-11-24 | Discharge: 2018-11-24 | Disposition: A | Discharge status: Home / Self Care | Payer: Medicare Other | Source: Ambulatory Visit | Attending: Radiation Oncology | Admitting: Radiation Oncology

## 2018-11-24 ENCOUNTER — Inpatient Hospital Stay: Payer: Medicare Other

## 2018-11-24 DIAGNOSIS — C7951 Secondary malignant neoplasm of bone: Secondary | ICD-10-CM

## 2018-11-24 DIAGNOSIS — M899 Disorder of bone, unspecified: Secondary | ICD-10-CM | POA: Diagnosis not present

## 2018-11-24 DIAGNOSIS — M8448XA Pathological fracture, other site, initial encounter for fracture: Secondary | ICD-10-CM | POA: Diagnosis not present

## 2018-11-24 DIAGNOSIS — Z79811 Long term (current) use of aromatase inhibitors: Secondary | ICD-10-CM | POA: Diagnosis not present

## 2018-11-24 DIAGNOSIS — Z9221 Personal history of antineoplastic chemotherapy: Secondary | ICD-10-CM | POA: Diagnosis not present

## 2018-11-24 DIAGNOSIS — Z510 Encounter for antineoplastic radiation therapy: Secondary | ICD-10-CM | POA: Diagnosis not present

## 2018-11-24 DIAGNOSIS — Z87891 Personal history of nicotine dependence: Secondary | ICD-10-CM | POA: Diagnosis not present

## 2018-11-24 DIAGNOSIS — C50919 Malignant neoplasm of unspecified site of unspecified female breast: Secondary | ICD-10-CM | POA: Diagnosis not present

## 2018-11-24 LAB — CBC
HCT: 46 % (ref 36.0–46.0)
Hemoglobin: 14.8 g/dL (ref 12.0–15.0)
MCH: 29.7 pg (ref 26.0–34.0)
MCHC: 32.2 g/dL (ref 30.0–36.0)
MCV: 92.2 fL (ref 80.0–100.0)
Platelets: 168 10*3/uL (ref 150–400)
RBC: 4.99 MIL/uL (ref 3.87–5.11)
RDW: 13 % (ref 11.5–15.5)
WBC: 5.5 10*3/uL (ref 4.0–10.5)
nRBC: 0 % (ref 0.0–0.2)

## 2018-11-24 MED FILL — IBRANCE 125 MG CAPSULE: 125 | 28 days supply | Qty: 21 | Fill #0

## 2018-11-24 MED FILL — LETROZOLE 2.5 MG TABLET: 2.5 | 30 days supply | Qty: 30 | Fill #0

## 2018-11-25 ENCOUNTER — Ambulatory Visit
Admission: RE | Admit: 2018-11-25 | Discharge: 2018-11-25 | Disposition: A | Discharge status: Home / Self Care | Payer: Medicare Other | Source: Ambulatory Visit | Attending: Radiation Oncology | Admitting: Radiation Oncology

## 2018-11-25 DIAGNOSIS — C7951 Secondary malignant neoplasm of bone: Secondary | ICD-10-CM | POA: Diagnosis not present

## 2018-11-25 DIAGNOSIS — Z510 Encounter for antineoplastic radiation therapy: Secondary | ICD-10-CM | POA: Diagnosis not present

## 2018-11-26 ENCOUNTER — Ambulatory Visit
Admission: RE | Admit: 2018-11-26 | Discharge: 2018-11-26 | Disposition: A | Discharge status: Home / Self Care | Payer: Medicare Other | Source: Ambulatory Visit | Attending: Radiation Oncology | Admitting: Radiation Oncology

## 2018-11-26 DIAGNOSIS — Z510 Encounter for antineoplastic radiation therapy: Secondary | ICD-10-CM | POA: Diagnosis not present

## 2018-11-26 DIAGNOSIS — C7951 Secondary malignant neoplasm of bone: Secondary | ICD-10-CM | POA: Diagnosis not present

## 2018-11-29 ENCOUNTER — Ambulatory Visit
Admission: RE | Admit: 2018-11-29 | Discharge: 2018-11-29 | Disposition: A | Discharge status: Home / Self Care | Payer: Medicare Other | Source: Ambulatory Visit | Attending: Radiation Oncology | Admitting: Radiation Oncology

## 2018-11-29 DIAGNOSIS — Z510 Encounter for antineoplastic radiation therapy: Secondary | ICD-10-CM | POA: Insufficient documentation

## 2018-11-29 DIAGNOSIS — C7951 Secondary malignant neoplasm of bone: Secondary | ICD-10-CM | POA: Insufficient documentation

## 2018-11-30 ENCOUNTER — Ambulatory Visit
Admission: RE | Admit: 2018-11-30 | Discharge: 2018-11-30 | Disposition: A | Discharge status: Home / Self Care | Payer: Medicare Other | Source: Ambulatory Visit | Attending: Radiation Oncology | Admitting: Radiation Oncology

## 2018-11-30 DIAGNOSIS — C7951 Secondary malignant neoplasm of bone: Secondary | ICD-10-CM | POA: Diagnosis not present

## 2018-11-30 DIAGNOSIS — Z510 Encounter for antineoplastic radiation therapy: Secondary | ICD-10-CM | POA: Diagnosis not present

## 2018-12-01 ENCOUNTER — Inpatient Hospital Stay: Payer: Medicare Other | Attending: Radiation Oncology

## 2018-12-01 ENCOUNTER — Ambulatory Visit
Admission: RE | Admit: 2018-12-01 | Discharge: 2018-12-01 | Disposition: A | Discharge status: Home / Self Care | Payer: Medicare Other | Source: Ambulatory Visit | Attending: Radiation Oncology | Admitting: Radiation Oncology

## 2018-12-01 DIAGNOSIS — Z87891 Personal history of nicotine dependence: Secondary | ICD-10-CM | POA: Insufficient documentation

## 2018-12-01 DIAGNOSIS — Z9221 Personal history of antineoplastic chemotherapy: Secondary | ICD-10-CM | POA: Diagnosis not present

## 2018-12-01 DIAGNOSIS — Z79811 Long term (current) use of aromatase inhibitors: Secondary | ICD-10-CM | POA: Insufficient documentation

## 2018-12-01 DIAGNOSIS — Z923 Personal history of irradiation: Secondary | ICD-10-CM | POA: Diagnosis not present

## 2018-12-01 DIAGNOSIS — M899 Disorder of bone, unspecified: Secondary | ICD-10-CM | POA: Diagnosis not present

## 2018-12-01 DIAGNOSIS — C7951 Secondary malignant neoplasm of bone: Secondary | ICD-10-CM | POA: Insufficient documentation

## 2018-12-01 DIAGNOSIS — C50919 Malignant neoplasm of unspecified site of unspecified female breast: Secondary | ICD-10-CM | POA: Diagnosis not present

## 2018-12-01 DIAGNOSIS — Z79899 Other long term (current) drug therapy: Secondary | ICD-10-CM | POA: Insufficient documentation

## 2018-12-01 DIAGNOSIS — Z510 Encounter for antineoplastic radiation therapy: Secondary | ICD-10-CM | POA: Diagnosis not present

## 2018-12-01 LAB — COMPREHENSIVE METABOLIC PANEL
ALT: 82 U/L — ABNORMAL HIGH (ref 0–44)
ANION GAP: 7 (ref 5–15)
AST: 47 U/L — ABNORMAL HIGH (ref 15–41)
Albumin: 3.8 g/dL (ref 3.5–5.0)
Alkaline Phosphatase: 80 U/L (ref 38–126)
BUN: 15 mg/dL (ref 8–23)
CO2: 26 mmol/L (ref 22–32)
Calcium: 8.8 mg/dL — ABNORMAL LOW (ref 8.9–10.3)
Chloride: 104 mmol/L (ref 98–111)
Creatinine, Ser: 0.63 mg/dL (ref 0.44–1.00)
GFR calc Af Amer: >60 mL/min (ref >60)
GFR calc non Af Amer: >60 mL/min (ref >60)
Glucose, Bld: 133 mg/dL — ABNORMAL HIGH (ref 70–99)
Potassium: 3.8 mmol/L (ref 3.5–5.1)
Sodium: 137 mmol/L (ref 135–145)
Total Bilirubin: 0.4 mg/dL (ref 0.3–1.2)
Total Protein: 6.8 g/dL (ref 6.5–8.1)

## 2018-12-01 LAB — CBC WITH DIFFERENTIAL/PLATELET
Abs Immature Granulocytes: 0.01 10*3/uL (ref 0.00–0.07)
BASOS PCT: 0 %
Basophils Absolute: 0 10*3/uL (ref 0.0–0.1)
Eosinophils Absolute: 0.4 10*3/uL (ref 0.0–0.5)
Eosinophils Relative: 9 %
HCT: 43.7 % (ref 36.0–46.0)
Hemoglobin: 14.1 g/dL (ref 12.0–15.0)
Immature Granulocytes: 0 %
Lymphocytes Relative: 16 %
Lymphs Abs: 0.7 10*3/uL (ref 0.7–4.0)
MCH: 29.3 pg (ref 26.0–34.0)
MCHC: 32.3 g/dL (ref 30.0–36.0)
MCV: 90.7 fL (ref 80.0–100.0)
Monocytes Absolute: 0.3 10*3/uL (ref 0.1–1.0)
Monocytes Relative: 6 %
Neutro Abs: 3.1 10*3/uL (ref 1.7–7.7)
Neutrophils Relative %: 69 %
PLATELETS: 139 10*3/uL — AB (ref 150–400)
RBC: 4.82 MIL/uL (ref 3.87–5.11)
RDW: 13 % (ref 11.5–15.5)
WBC: 4.5 10*3/uL (ref 4.0–10.5)
nRBC: 0 % (ref 0.0–0.2)

## 2018-12-06 NOTE — Telephone Encounter (Signed)
Oral Oncology Patient Advocate Encounter   Was successful in securing patient a $16000 grant from [facility] [place] (PAF) to provide copayment coverage for [place].  This will keep the out of pocket expense at $0.     I have spoken with the patient.    The billing information is as follows and has been shared with [place].   RxBin: [id number] PCN:  [id number] Member ID: [id number] Group ID: [id number] Dates of Eligibility: 11/18/2018 through 11/18/2019  [place] [facility] [place] Phone [phone] Fax [phone]

## 2018-12-09 ENCOUNTER — Inpatient Hospital Stay (HOSPITAL_BASED_OUTPATIENT_CLINIC_OR_DEPARTMENT_OTHER): Payer: Medicare Other | Admitting: Oncology

## 2018-12-09 ENCOUNTER — Inpatient Hospital Stay: Payer: Medicare Other

## 2018-12-09 ENCOUNTER — Other Ambulatory Visit: Payer: Self-pay

## 2018-12-09 VITALS — BP 124/78 | HR 80 | Temp 97.2°F | Resp 18 | Wt 168.0 lb

## 2018-12-09 DIAGNOSIS — C50919 Malignant neoplasm of unspecified site of unspecified female breast: Secondary | ICD-10-CM

## 2018-12-09 DIAGNOSIS — Z87891 Personal history of nicotine dependence: Secondary | ICD-10-CM | POA: Diagnosis not present

## 2018-12-09 DIAGNOSIS — Z79811 Long term (current) use of aromatase inhibitors: Secondary | ICD-10-CM | POA: Diagnosis not present

## 2018-12-09 DIAGNOSIS — Z923 Personal history of irradiation: Secondary | ICD-10-CM | POA: Diagnosis not present

## 2018-12-09 DIAGNOSIS — Z79899 Other long term (current) drug therapy: Secondary | ICD-10-CM | POA: Diagnosis not present

## 2018-12-09 DIAGNOSIS — C7951 Secondary malignant neoplasm of bone: Secondary | ICD-10-CM

## 2018-12-09 LAB — COMPREHENSIVE METABOLIC PANEL
ALT: 81 U/L — AB (ref 0–44)
AST: 46 U/L — ABNORMAL HIGH (ref 15–41)
Albumin: 3.8 g/dL (ref 3.5–5.0)
Alkaline Phosphatase: 60 U/L (ref 38–126)
Anion gap: 8 (ref 5–15)
BUN: 14 mg/dL (ref 8–23)
CO2: 28 mmol/L (ref 22–32)
CREATININE: 0.57 mg/dL (ref 0.44–1.00)
Calcium: 9.1 mg/dL (ref 8.9–10.3)
Chloride: 104 mmol/L (ref 98–111)
GFR calc non Af Amer: >60 mL/min (ref >60)
Glucose, Bld: 89 mg/dL (ref 70–99)
Potassium: 3.9 mmol/L (ref 3.5–5.1)
Sodium: 140 mmol/L (ref 135–145)
Total Bilirubin: 0.7 mg/dL (ref 0.3–1.2)
Total Protein: 6.8 g/dL (ref 6.5–8.1)

## 2018-12-09 LAB — CBC WITH DIFFERENTIAL/PLATELET
Abs Immature Granulocytes: 0.01 10*3/uL (ref 0.00–0.07)
Basophils Absolute: 0 10*3/uL (ref 0.0–0.1)
Basophils Relative: 1 %
Eosinophils Absolute: 0.2 10*3/uL (ref 0.0–0.5)
Eosinophils Relative: 6 %
HEMATOCRIT: 41.7 % (ref 36.0–46.0)
Hemoglobin: 13.8 g/dL (ref 12.0–15.0)
Immature Granulocytes: 0 %
Lymphocytes Relative: 18 %
Lymphs Abs: 0.6 10*3/uL — ABNORMAL LOW (ref 0.7–4.0)
MCH: 30.3 pg (ref 26.0–34.0)
MCHC: 33.1 g/dL (ref 30.0–36.0)
MCV: 91.6 fL (ref 80.0–100.0)
Monocytes Absolute: 0.1 10*3/uL (ref 0.1–1.0)
Monocytes Relative: 3 %
Neutro Abs: 2.6 10*3/uL (ref 1.7–7.7)
Neutrophils Relative %: 72 %
Platelets: 143 10*3/uL — ABNORMAL LOW (ref 150–400)
RBC: 4.55 MIL/uL (ref 3.87–5.11)
RDW: 13 % (ref 11.5–15.5)
Smear Review: NORMAL
WBC: 3.6 10*3/uL — ABNORMAL LOW (ref 4.0–10.5)
nRBC: 0 % (ref 0.0–0.2)

## 2018-12-09 NOTE — Progress Notes (Signed)
Here for follow up. Overall stated she is doing well-everyday gets better after radiation completed. More mobile also. Stated mid abd / cramping intermittent pain level 4 when she gets it. ( had few episodes today )

## 2018-12-10 IMAGING — US US THYROID BIOPSY
1 series · 13 of 14 positions shown · non-contrast
Comparison: 07/29/2017

MEDICATIONS:
None

COMPLICATIONS:
None immediate.

INDICATION: Indeterminate thyroid nodule

EXAM:
ULTRASOUND GUIDED FINE NEEDLE ASPIRATION OF INDETERMINATE THYROID
NODULE
TECHNIQUE: Informed written consent was obtained from the patient after a
discussion of the risks, benefits and alternatives to treatment.
Questions regarding the procedure were encouraged and answered. A
timeout was performed prior to the initiation of the procedure.

[Series 1: us thyroid biopsy · 0.06mm/px · 13 of 14 slices shown]
[im 1/14]
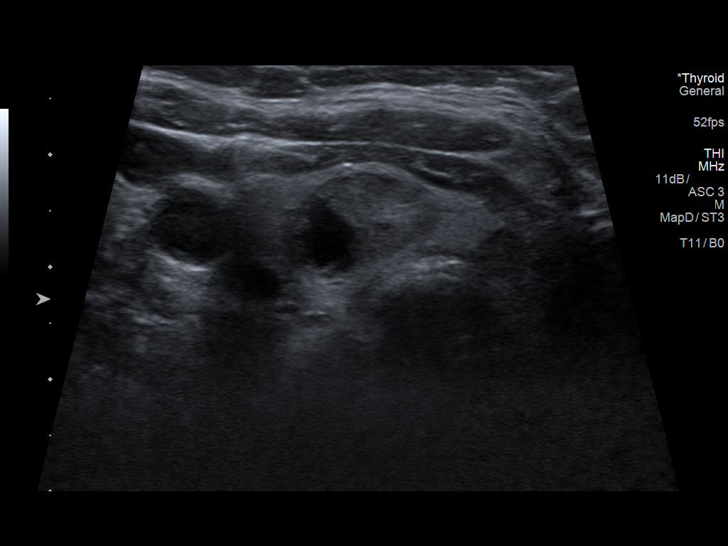
[im 2/14]
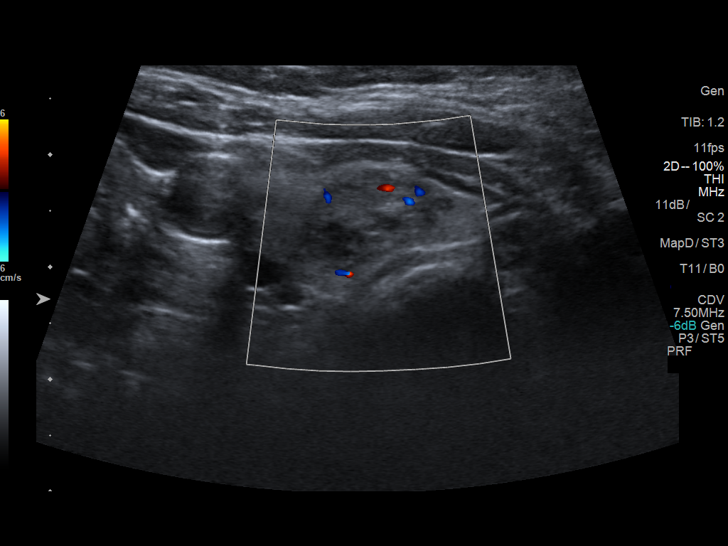
[im 3/14]
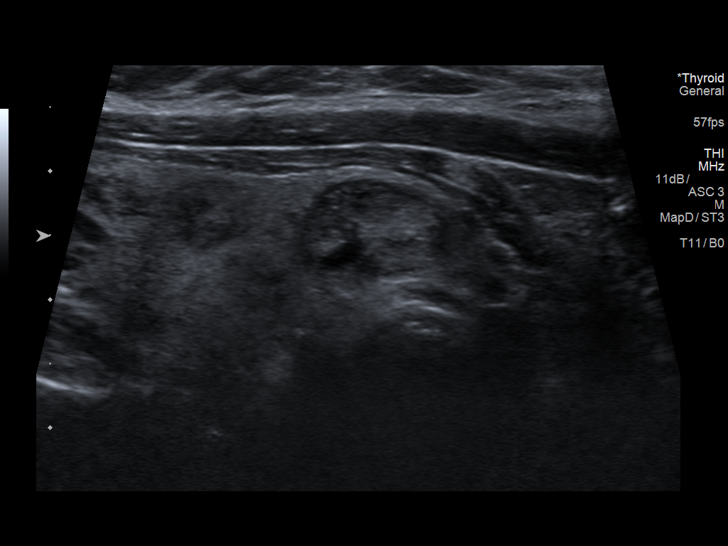
[im 4/14]
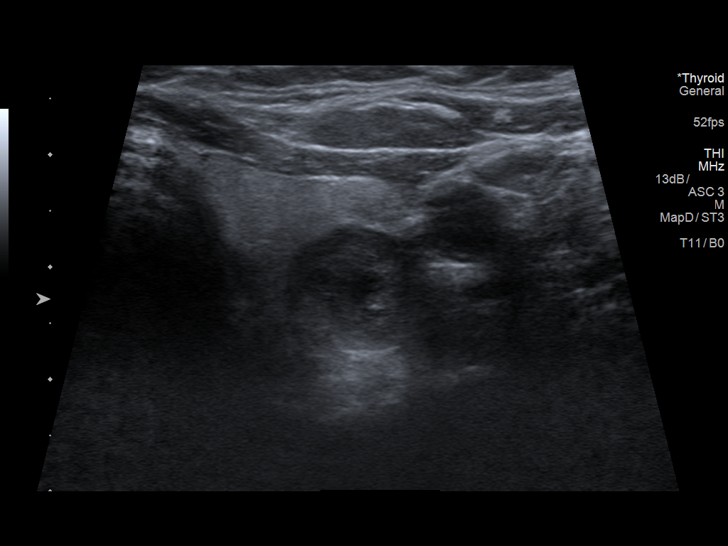
[im 5/14]
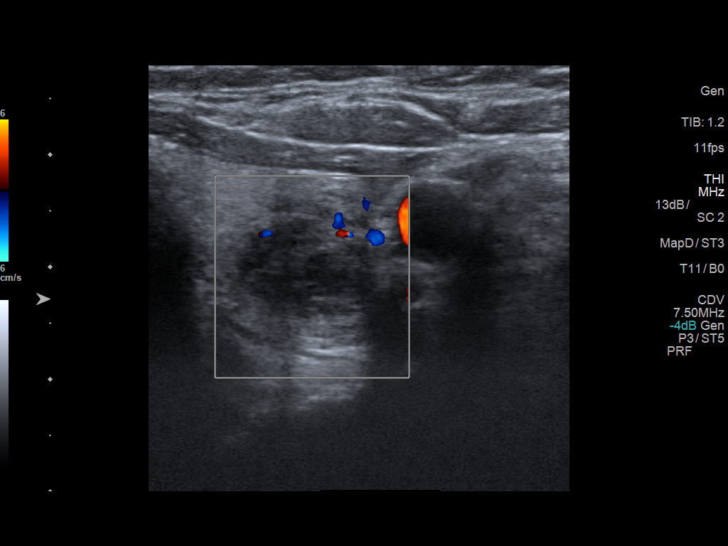
[im 6/14]
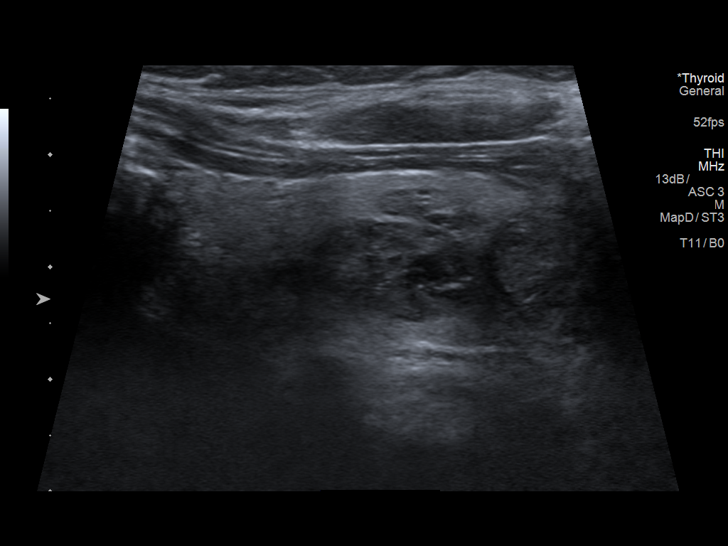
[im 8/14]
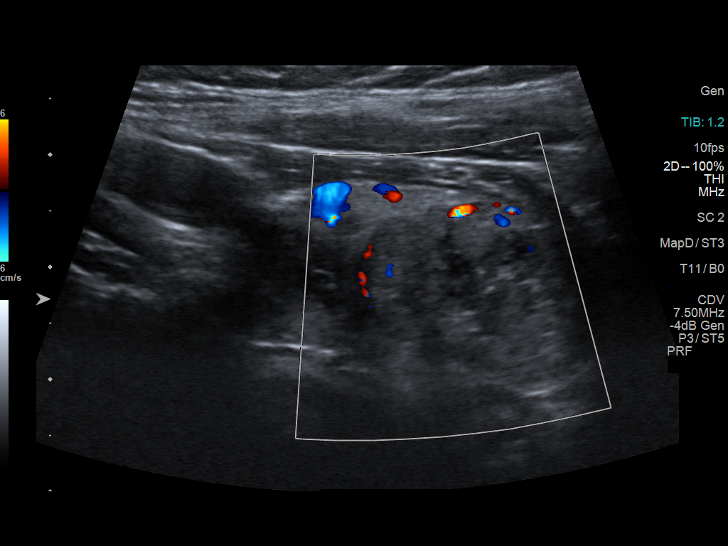
[im 9/14]
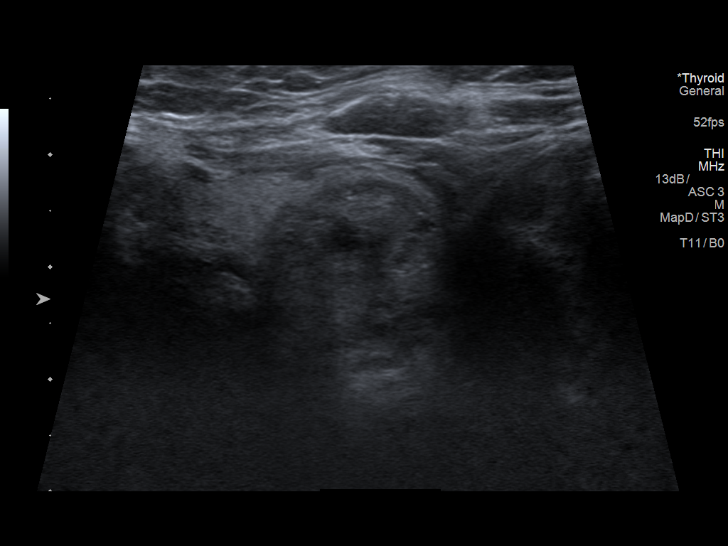
[im 10/14]
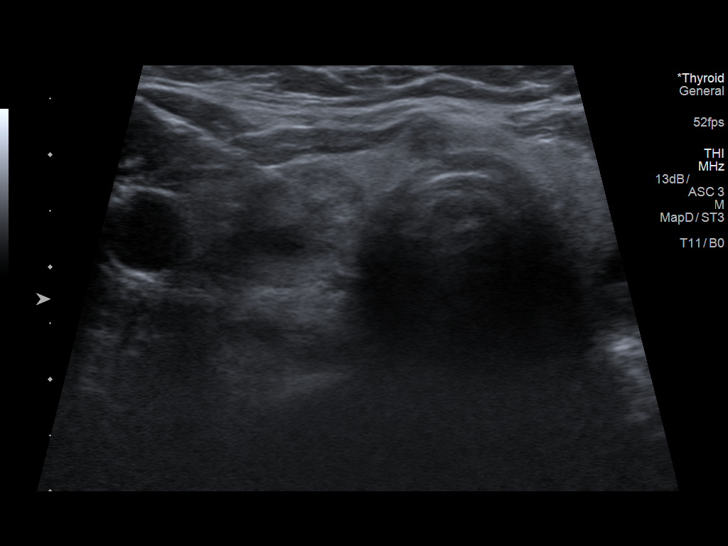
[im 11/14]
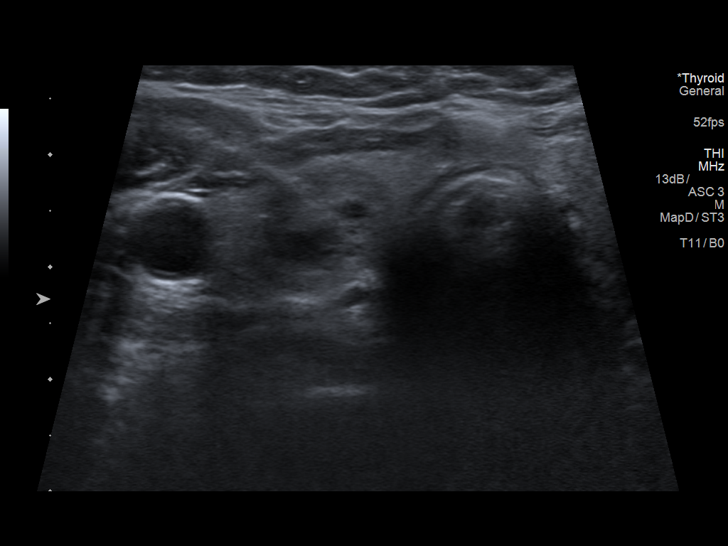
[im 12/14]
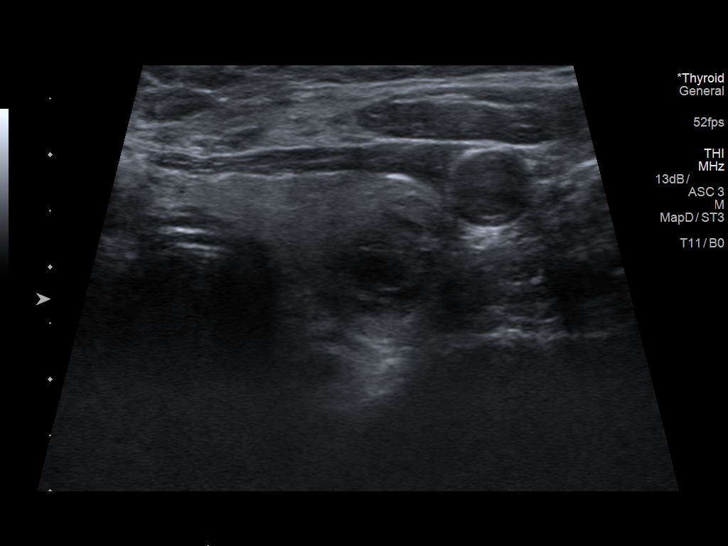
[im 13/14]
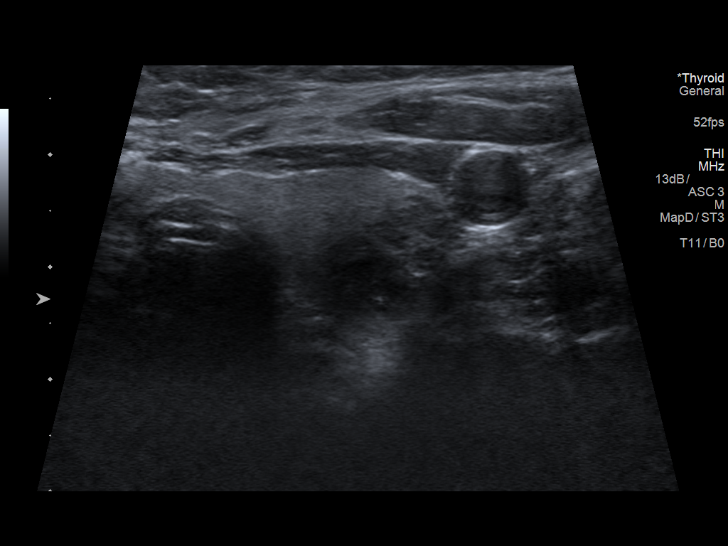
[im 14/14]
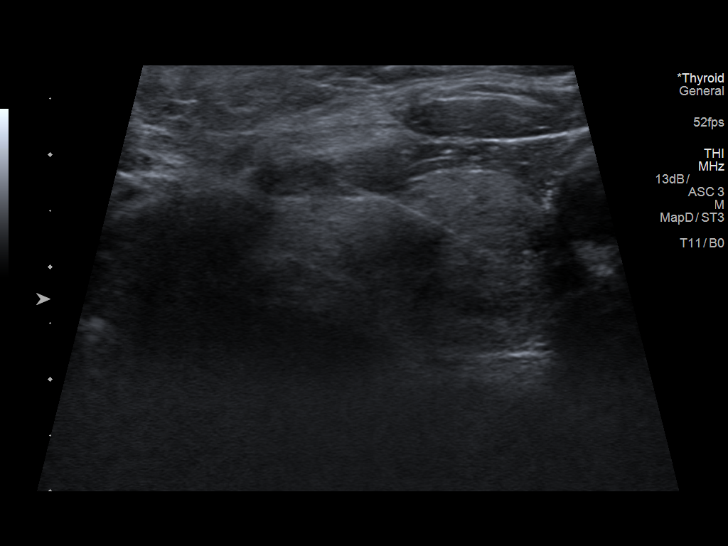

[13 of 14 positions shown; findings below may reference images not displayed]

Pre-procedural ultrasound scanning demonstrated unchanged size and
appearance of the indeterminate nodules within the left lobe of the
thyroid

The procedure was planned. The neck was prepped in the usual sterile
fashion, and a sterile drape was applied covering the operative
field. A timeout was performed prior to the initiation of the
procedure. Local anesthesia was provided with 1% lidocaine.

Under direct ultrasound guidance, 5 FNA biopsies were performed of
the left mid thyroid nodule with a 25 gauge needle. Multiple
ultrasound images were saved for procedural documentation purposes.
The samples were prepared and submitted to pathology.

Under direct ultrasound guidance, 5 FNA biopsies were performed of
the left inferior thyroid nodule with a 25 gauge needle. Multiple
ultrasound images were saved for procedural documentation purposes.
The samples were prepared and submitted to pathology.

Limited post procedural scanning was negative for hematoma or
additional complication. Dressings were placed. The patient
tolerated the above procedures procedure well without immediate
postprocedural complication.
FINDINGS: Nodule reference number based on prior diagnostic ultrasound: 3

Maximum size: 1.6 cm

Location: Left; mid

ACR TI-RADS risk category: TR4 (4-6 points)

Reason for biopsy: meets ACR TI-RADS criteria

_________________________________________________________

Nodule reference number based on prior diagnostic ultrasound: 4

Maximum size: 2.3 cm

Location: Left; inferior

ACR TI-RADS risk category: TR4 (4-6 points)

Reason for biopsy: meets ACR TI-RADS criteria

Ultrasound imaging confirms appropriate placement of the needles
within the thyroid nodule.
IMPRESSION: 1. Technically successful ultrasound guided fine needle aspiration
of left mid thyroid nodule
2. Technically successful ultrasound guided fine needle aspiration
of left inferior thyroid nodule

## 2018-12-13 ENCOUNTER — Encounter: Payer: Self-pay | Admitting: Oncology

## 2018-12-13 ENCOUNTER — Telehealth: Payer: Self-pay | Admitting: *Deleted

## 2018-12-13 NOTE — Telephone Encounter (Signed)
Janese Banks wanted me to call Ms. Rose Fillers and see if she is going to have a dental clearance while she is here in [place] or if she plans on getting it done in [place] when she moves in April.  Patient states that she has a friend that is a Pharmacist, community so she will call them and ask them for an exam.  He feels like she is going to need a lot of work done and if that is the case she will wait till [place] but she will at least try getting an appointment with dentist to find out what kind of shape her teeth are in.  Of asked her to call me back whenever she talks to the dentist and see if she can work out something while she is here.  He is agreeable to get it checked out and call me back.

## 2018-12-13 NOTE — Progress Notes (Signed)
Hematology/Oncology Consult note [facility]  Telephone:([phone] [phone]  Patient Care Team: Virginia Crews, MD as PCP - General (Family Medicine) Beverly Gust, MD (Otolaryngology)   Name of the patient: April Stuart  [id number]  Dec 26, 1950   Date of visit: 12/13/18  Diagnosis-  metastatic breast cancer with bone only mets  Chief complaint/ Reason for visit-routine follow-up of breast cancer on Ibrance and letrozole  Heme/Onc history: patient is a 68 year old female with a history of breast cancer back in 2008. She is status post neoadjuvant chemotherapy lumpectomy adjuvant radiation and 5 years of hormone therapy. I do not have the details of her breast cancer at this time since all her care was at [place].We did obtain some partial records which suggested that patient had a 2.3 x 1.8 x 2.3 cm mass in her right breast which was invading the pectoralis muscle there were 2 satellite masses as well as worrisome lymph node seen on MRI before she received neoadjuvant chemotherapy. Her final pathology showed residual tumor 2 separate foci 2 cm and 0.9 cm grade 2 with grade 1 respectively. No lymph node involvement. Presumably ER PR positive since patient received endocrine therapy for 5 years.  She otherwise has no significant comorbidities. She reports falling off her treadmill about 4 to 5 months ago and since then she has been having ongoing back pain which particularly got worse around Thanksgiving. She reports that her pain is relatively well controlled with ibuprofen when she is sitting in 1 place or laying down but she has uncontrolled pain at times when she ambulates. Also notes bilateral leg spasms. Denies any weakness or numbness in her extremities. Denies any bowel or bladder incontinence.  Patient underwent lumbar spine x-ray in December 2019 which showed L4 compression fracture and was referred to Dr. Rudene Christians for further  evaluation. She underwent MRI of her lumbar spine without contrast on 10/29/2017 which showed an L4 vertebral body fracture with 70% maximal height loss centrally. Complete replacement of normal bone marrow signal throughout the L4 vertebral body extending into the pedicles and facets bilaterally. The right L4 pedicle appears expanded with small volume extraosseous tumor not excluded in this region. There is bulging of vertebral body cortex diffusely including posteriorly into the spinal canal with small volume epidural tumor not excluded. 2 hypointense lesions located in L3 as well as 2.5 cm lesion in the posterior right ilium. Findings may reflect metastatic disease or multiple myeloma  PET CT scan showed multiple malignant osseous lesions including a pathological compression fracture at L4, lytic lesion involving L3 vertebral body and 1.7 cm lytic lesion in the anterior aspect of the right ilium. There was no evidence for any primary malignancy elsewhere. Patient decided not to pursue kyphoplasty and biopsy and instead went on to get CT-guided biopsy of the ilium which showed metastatic carcinoma compatible with breast primary. ER 51 TO 90% POSITIVE, PR 11 to 50% positive and HER-2 negative  She completed palliative radiation to her L-spine.  She was started on letrozole and Ibrance on 12/02/2018.  Interval history-she reports significant improvement in her back pain since radiation.  She is able to ambulate better and has not really used narcotic pain medications.  She plans to move to [facility] on 01/14/2019 and has an appointment at [facility] on 02/07/2019  ECOG PS- 1 Pain scale- 0 Opioid associated constipation- no  Review of systems- Review of Systems  Constitutional: Negative for chills, fever, malaise/fatigue and weight loss.  HENT: Negative for congestion, ear discharge and nosebleeds.   Eyes: Negative for blurred vision.  Respiratory: Negative for cough, hemoptysis, sputum production,  shortness of breath and wheezing.   Cardiovascular: Negative for chest pain, palpitations, orthopnea and claudication.  Gastrointestinal: Negative for abdominal pain, blood in stool, constipation, diarrhea, heartburn, melena, nausea and vomiting.  Genitourinary: Negative for dysuria, flank pain, frequency, hematuria and urgency.  Musculoskeletal: Positive for back pain. Negative for joint pain and myalgias.  Skin: Negative for rash.  Neurological: Negative for dizziness, tingling, focal weakness, seizures, weakness and headaches.  Endo/Heme/Allergies: Does not bruise/bleed easily.  Psychiatric/Behavioral: Negative for depression and suicidal ideas. The patient does not have insomnia.        Allergies  Allergen Reactions  . Penicillins Shortness Of Breath and Swelling    Facial swelling  . Sulfa Antibiotics Swelling    Facial swelling  . Atorvastatin Other (See Comments)    myalgias  . Rosuvastatin Calcium Other (See Comments)    Myalgias      Past Medical History:  Diagnosis Date  . Breast cancer ([place]) 2008   R breast  . Personal history of chemotherapy 03/2007   right breast ca  . Personal history of radiation therapy 2008   right breast ca  . Thyroid nodule 2017     Past Surgical History:  Procedure Laterality Date  . APPENDECTOMY  1958  . BARTHOLIN GLAND CYST EXCISION  1978  . BREAST BIOPSY Right 03/19/2007   carcinoma  . BREAST BIOPSY Right 08/10/2013   rt breast 3:00 coil clip benign per pt done in [place], 9:00 wing clip benign per pt done in [place] Right ?   ribbon marker benign  . BREAST EXCISIONAL BIOPSY Left 2017   pt states benign lymph nodes removed from axilla  . BREAST LUMPECTOMY Right 08/11/2007   with axillary dissection.   Marland Kitchen Conception   x [place]  . TUBAL LIGATION  1976    Social History   Socioeconomic History  . Marital status: Divorced    Spouse name: Not on file  . Number of  children: 3  . Years of education: Not on file  . Highest education level: Master's degree (e.g., MA, MS, MEng, MEd, MSW, MBA)  Occupational History  . 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 Financial resource strain: Not hard at all  . Food insecurity:    Worry: Never true    Inability: Never true  . Transportation needs:    Medical: No    Non-medical: No  Tobacco Use  . Smoking status: Former Smoker    Packs/day: 2.00    Years: 38.00    Pack years: 76.00    Types: Cigarettes    Last attempt to quit: 10/26/1997    Years since quitting: 21.1  . Smokeless tobacco: Never Used  Substance and Sexual Activity  . Alcohol use: Yes    Alcohol/week: 7.0 standard drinks    Types: 7 Glasses of wine per week  . Drug use: No  . Sexual activity: Never    Birth control/protection: Surgical  Lifestyle  . Physical activity:    Days per week: Not on file    Minutes per session: Not on file  . Stress: Not at all  Relationships  . Social connections:    Talks on phone: Not on file    Gets together:  Not on file    Attends religious service: Not on file    Active member of club or organization: Not on file    Attends meetings of clubs or organizations: Not on file    Relationship status: Not on file  . Intimate partner violence:    Fear of current or ex partner: Not on file    Emotionally abused: Not on file    Physically abused: Not on file    Forced sexual activity: Not on file  Other Topics Concern  . Not on file  Social History Narrative  . Not on file    Family History  Problem Relation Age of Onset  . Dementia Mother   . Stroke Mother 42  . Pancreatic cancer Father   . Prostate cancer Father   . HIV Brother   . Breast cancer Neg Hx   . Colon cancer Neg Hx      Current Outpatient Medications:  .  calcium-vitamin D (OSCAL WITH D) 500-200 MG-UNIT tablet, Take 1 tablet by mouth 2 (two) times daily., Disp: ,  Rfl:  .  letrozole (FEMARA) 2.5 MG tablet, Take 1 tablet (2.5 mg total) by mouth daily., Disp: 30 tablet, Rfl: 2 .  palbociclib (IBRANCE) 125 MG capsule, Take 1 capsule (125 mg total) by mouth daily. Take whole with food. Take for 21 days on, 7 days off, repeat every 28 days., Disp: 21 capsule, Rfl: 1 .  Biotin 5000 MCG CAPS, Take 5,000 mcg by mouth daily., Disp: , Rfl:  .  cholecalciferol (VITAMIN D) 1000 units tablet, Take 2,000 Units by mouth daily. , Disp: , Rfl:  .  cyclobenzaprine (FLEXERIL) 5 MG tablet, Take 1 tablet (5 mg total) by mouth 3 (three) times daily as needed for muscle spasms. (Patient not taking: Reported on 12/09/2018), Disp: 30 tablet, Rfl: 1 .  lansoprazole (PREVACID) 30 MG capsule, Take 1 capsule (30 mg total) by mouth daily at 12 noon. (Patient not taking: Reported on 12/09/2018), Disp: 30 capsule, Rfl: 3 .  ondansetron (ZOFRAN) 8 MG tablet, Take 1 tablet (8 mg total) by mouth every 8 (eight) hours as needed for nausea or vomiting. (Patient not taking: Reported on 12/09/2018), Disp: 20 tablet, Rfl: 0  Physical exam:  Vitals:   12/09/18 1421  BP: 124/78  Pulse: 80  Resp: 18  Temp: (!) 97.2 F (36.2 C)  TempSrc: Tympanic  Weight: 168 lb (76.2 kg)   Physical Exam HENT:     Head: Normocephalic and atraumatic.  Eyes:     Pupils: Pupils are equal, round, and reactive to light.  Neck:     Musculoskeletal: Normal range of motion.  Cardiovascular:     Rate and Rhythm: Normal rate and regular rhythm.     Heart sounds: Normal heart sounds.  Pulmonary:     Effort: Pulmonary effort is normal.     Breath sounds: Normal breath sounds.  Abdominal:     General: Bowel sounds are normal.     Palpations: Abdomen is soft.  Skin:    General: Skin is warm and dry.  Neurological:     Mental Status: She is alert and oriented to person, place, and time.      CMP Latest Ref Rng & Units 12/09/2018  Glucose 70 - 99 mg/dL 89  BUN 8 - 23 mg/dL 14  Creatinine 0.44 - 1.00 mg/dL 0.57   Sodium 135 - 145 mmol/L 140  Potassium 3.5 - 5.1 mmol/L 3.9  Chloride 98 - 111 mmol/L  104  CO2 22 - 32 mmol/L 28  Calcium 8.9 - 10.3 mg/dL 9.1  Total Protein 6.5 - 8.1 g/dL 6.8  Total Bilirubin 0.3 - 1.2 mg/dL 0.7  Alkaline Phos 38 - 126 U/L 60  AST 15 - 41 U/L 46(H)  ALT 0 - 44 U/L 81(H)   CBC Latest Ref Rng & Units 12/09/2018  WBC 4.0 - 10.5 K/uL 3.6(L)  Hemoglobin 12.0 - 15.0 g/dL 13.8  Hematocrit 36.0 - 46.0 % 41.7  Platelets 150 - 400 K/uL 143(L)    No images are attached to the encounter.  Mm Diag Breast Tomo Bilateral  Result Date: 11/19/2018 CLINICAL DATA:  History of treated right breast cancer, status post lumpectomy radiation, chemo and radiation therapy in 2008.The patient was recently diagnosed with metastatic osseous disease from breast primary malignancy. EXAM: DIGITAL DIAGNOSTIC BILATERAL MAMMOGRAM WITH CAD AND TOMO COMPARISON:  Previous exam(s). ACR Breast Density Category c: The breast tissue is heterogeneously dense, which may obscure small masses. FINDINGS: Mammographically, there are no suspicious masses, areas of nonsurgical architectural distortion or microcalcifications in either breast. Stable postsurgical changes are seen in the right breast and left axilla. Mammographic images were processed with CAD. IMPRESSION: No mammographic evidence of malignancy in either breast, status post right lumpectomy. RECOMMENDATION: Screening mammogram in one year.(Code:SM-B-01Y) I have discussed the findings and recommendations with the patient. Results were also provided in writing at the conclusion of the visit. If applicable, a reminder letter will be sent to the patient regarding the next appointment. BI-RADS CATEGORY  2: Benign. Electronically Signed   By: Fidela Salisbury M.D.   On: 11/19/2018 13:19     Assessment and plan- Patient is a 68 y.o. female with newly diagnosed metastatic breast cancer with bone only metastases.  History of breast cancer in 2008 status post  surgery chemotherapy radiation treatment and 5 years of hormone therapy  Patient started taking her letrozole and Ibrance on 12/12/2018 and she is tolerating it well without any significant side effects.  She will return to clinic next week on 12/24/2018 for a repeat CBC with differential at 2 weeks for cycle 1  I will see her back on 12/27/2018 with CBC with differential, CMP, CA-15-3 and CA-27-29 prior to starting cycle 2 of Ibrance which she takes 3 weeks on and one-week off.  She understands and knows to take letrozole every day along with calcium and vitamin D.  She is planning to move to [facility] on 01/14/2019 and we will check her repeat labs for 2 weeks cycle 2 on 01/13/2019.  Patient does not have an appointment with [facility] until 02/07/2019 but she will need another set of labs before she starts her third cycle of Ibrance.  We will have to find a way to either give her samples or prescription her [facility] to [facility] for her third cycle.  Subsequent cycles will be taken over by [facility]  Patient would also benefit from monthly bisphosphonates but she does not have a dentist in [place] and will likely find one when she moves to [place].  Zometa is on hold at this time   Visit Diagnosis 1. Metastatic breast cancer ([place])   2. High risk medication use      Dr. Randa Evens, MD, MPH [facility] at [facility] [phone] 12/13/2018 10:37 AM

## 2018-12-15 ENCOUNTER — Telehealth: Payer: Self-pay | Admitting: *Deleted

## 2018-12-15 NOTE — Telephone Encounter (Signed)
Patient called asking that [name] call her regarding a test

## 2018-12-15 NOTE — Telephone Encounter (Signed)
Pt wanted to know what her wbc count and neutrophil count was. I gave it and then she asked about a letter to give to people at airport. She is going to [place] next Monday and would like a letter to give her permission to have wheelchair at airport. Her legs are still hurting her some and she does not know how long of walking she will do at airport. I told her that I will check with Janese Banks and let her know

## 2018-12-16 ENCOUNTER — Telehealth: Payer: Self-pay

## 2018-12-16 ENCOUNTER — Inpatient Hospital Stay: Payer: Medicare Other

## 2018-12-16 ENCOUNTER — Encounter: Payer: Self-pay | Admitting: *Deleted

## 2018-12-16 DIAGNOSIS — Z79811 Long term (current) use of aromatase inhibitors: Secondary | ICD-10-CM | POA: Diagnosis not present

## 2018-12-16 DIAGNOSIS — Z923 Personal history of irradiation: Secondary | ICD-10-CM | POA: Diagnosis not present

## 2018-12-16 DIAGNOSIS — C7951 Secondary malignant neoplasm of bone: Secondary | ICD-10-CM | POA: Diagnosis not present

## 2018-12-16 DIAGNOSIS — Z79899 Other long term (current) drug therapy: Secondary | ICD-10-CM | POA: Diagnosis not present

## 2018-12-16 DIAGNOSIS — C50919 Malignant neoplasm of unspecified site of unspecified female breast: Secondary | ICD-10-CM | POA: Diagnosis not present

## 2018-12-16 DIAGNOSIS — Z87891 Personal history of nicotine dependence: Secondary | ICD-10-CM | POA: Diagnosis not present

## 2018-12-16 LAB — CBC WITH DIFFERENTIAL/PLATELET
ABS IMMATURE GRANULOCYTES: 0 10*3/uL (ref 0.00–0.07)
Basophils Absolute: 0 10*3/uL (ref 0.0–0.1)
Basophils Relative: 0 %
EOS PCT: 6 %
Eosinophils Absolute: 0.1 10*3/uL (ref 0.0–0.5)
HCT: 39.9 % (ref 36.0–46.0)
Hemoglobin: 13.6 g/dL (ref 12.0–15.0)
Lymphocytes Relative: 25 %
Lymphs Abs: 0.5 10*3/uL — ABNORMAL LOW (ref 0.7–4.0)
MCH: 30.6 pg (ref 26.0–34.0)
MCHC: 34.1 g/dL (ref 30.0–36.0)
MCV: 89.9 fL (ref 80.0–100.0)
Monocytes Absolute: 0.1 10*3/uL (ref 0.1–1.0)
Monocytes Relative: 4 %
Neutro Abs: 1.4 10*3/uL — ABNORMAL LOW (ref 1.7–7.7)
Neutrophils Relative %: 65 %
Platelets: 112 10*3/uL — ABNORMAL LOW (ref 150–400)
RBC Morphology: NORMAL
RBC: 4.44 MIL/uL (ref 3.87–5.11)
RDW: 12.8 % (ref 11.5–15.5)
Smear Review: NORMAL
WBC: 2.1 10*3/uL — ABNORMAL LOW (ref 4.0–10.5)
nRBC: 0 % (ref 0.0–0.2)

## 2018-12-16 NOTE — Telephone Encounter (Signed)
Call to pt who has a letter Dr Janese Banks wrote re travel and w/c use at the airport. Stated she will pick it up this afternoon. Letter left at front desk w Dianna. CAW

## 2018-12-18 ENCOUNTER — Encounter: Payer: Self-pay | Admitting: Oncology

## 2018-12-18 MED FILL — LETROZOLE 2.5 MG TABLET: 2.5 | 30 days supply | Qty: 30 | Fill #1

## 2018-12-18 MED FILL — IBRANCE 125 MG CAPSULE: 125 | 28 days supply | Qty: 21 | Fill #1

## 2018-12-24 ENCOUNTER — Other Ambulatory Visit: Payer: Self-pay

## 2018-12-24 DIAGNOSIS — C50919 Malignant neoplasm of unspecified site of unspecified female breast: Secondary | ICD-10-CM

## 2018-12-27 ENCOUNTER — Inpatient Hospital Stay (HOSPITAL_BASED_OUTPATIENT_CLINIC_OR_DEPARTMENT_OTHER): Payer: Medicare Other | Admitting: Oncology

## 2018-12-27 ENCOUNTER — Other Ambulatory Visit: Payer: Self-pay

## 2018-12-27 ENCOUNTER — Inpatient Hospital Stay: Payer: Medicare Other | Attending: Oncology

## 2018-12-27 VITALS — BP 126/82 | HR 81 | Temp 97.8°F | Resp 18 | Wt 165.7 lb

## 2018-12-27 DIAGNOSIS — Z9221 Personal history of antineoplastic chemotherapy: Secondary | ICD-10-CM | POA: Insufficient documentation

## 2018-12-27 DIAGNOSIS — Z87891 Personal history of nicotine dependence: Secondary | ICD-10-CM | POA: Insufficient documentation

## 2018-12-27 DIAGNOSIS — C7951 Secondary malignant neoplasm of bone: Secondary | ICD-10-CM

## 2018-12-27 DIAGNOSIS — C50919 Malignant neoplasm of unspecified site of unspecified female breast: Secondary | ICD-10-CM | POA: Diagnosis not present

## 2018-12-27 DIAGNOSIS — Z170 Estrogen receptor positive status [ER+]: Secondary | ICD-10-CM | POA: Diagnosis not present

## 2018-12-27 DIAGNOSIS — Z79811 Long term (current) use of aromatase inhibitors: Secondary | ICD-10-CM | POA: Insufficient documentation

## 2018-12-27 DIAGNOSIS — Z79899 Other long term (current) drug therapy: Secondary | ICD-10-CM | POA: Insufficient documentation

## 2018-12-27 DIAGNOSIS — Z923 Personal history of irradiation: Secondary | ICD-10-CM | POA: Insufficient documentation

## 2018-12-27 DIAGNOSIS — D702 Other drug-induced agranulocytosis: Secondary | ICD-10-CM

## 2018-12-27 LAB — COMPREHENSIVE METABOLIC PANEL
ALT: 49 U/L — ABNORMAL HIGH (ref 0–44)
ANION GAP: 5 (ref 5–15)
AST: 28 U/L (ref 15–41)
Albumin: 3.7 g/dL (ref 3.5–5.0)
Alkaline Phosphatase: 59 U/L (ref 38–126)
BUN: 20 mg/dL (ref 8–23)
CO2: 27 mmol/L (ref 22–32)
Calcium: 9 mg/dL (ref 8.9–10.3)
Chloride: 106 mmol/L (ref 98–111)
Creatinine, Ser: 0.67 mg/dL (ref 0.44–1.00)
GFR calc Af Amer: >60 mL/min (ref >60)
GFR calc non Af Amer: >60 mL/min (ref >60)
Glucose, Bld: 113 mg/dL — ABNORMAL HIGH (ref 70–99)
Potassium: 4 mmol/L (ref 3.5–5.1)
SODIUM: 138 mmol/L (ref 135–145)
Total Bilirubin: 0.4 mg/dL (ref 0.3–1.2)
Total Protein: 6.7 g/dL (ref 6.5–8.1)

## 2018-12-27 LAB — CBC WITH DIFFERENTIAL/PLATELET
Abs Immature Granulocytes: 0 10*3/uL (ref 0.00–0.07)
Basophils Absolute: 0 10*3/uL (ref 0.0–0.1)
Basophils Relative: 1 %
Eosinophils Absolute: 0 10*3/uL (ref 0.0–0.5)
Eosinophils Relative: 2 %
HCT: 36.8 % (ref 36.0–46.0)
Hemoglobin: 12.6 g/dL (ref 12.0–15.0)
Immature Granulocytes: 0 %
Lymphocytes Relative: 32 %
Lymphs Abs: 0.5 10*3/uL — ABNORMAL LOW (ref 0.7–4.0)
MCH: 31.2 pg (ref 26.0–34.0)
MCHC: 34.2 g/dL (ref 30.0–36.0)
MCV: 91.1 fL (ref 80.0–100.0)
Monocytes Absolute: 0.2 10*3/uL (ref 0.1–1.0)
Monocytes Relative: 10 %
Neutro Abs: 0.9 10*3/uL — ABNORMAL LOW (ref 1.7–7.7)
Neutrophils Relative %: 55 %
Platelets: 151 10*3/uL (ref 150–400)
RBC: 4.04 MIL/uL (ref 3.87–5.11)
RDW: 14.1 % (ref 11.5–15.5)
WBC: 1.6 10*3/uL — ABNORMAL LOW (ref 4.0–10.5)
nRBC: 0 % (ref 0.0–0.2)

## 2018-12-27 NOTE — Progress Notes (Signed)
Here for follow up. Overall stated " im doing good-feeling back to normal " pt moving to [facility] march 20 /2020

## 2018-12-28 ENCOUNTER — Encounter: Payer: Self-pay | Admitting: Oncology

## 2018-12-28 LAB — CANCER ANTIGEN 15-3: CA 15-3: 15.1 U/mL (ref 0.0–25.0)

## 2018-12-28 LAB — CANCER ANTIGEN 27.29: CA 27.29: 18.4 U/mL (ref 0.0–38.6)

## 2018-12-28 NOTE — Progress Notes (Signed)
Hematology/Oncology Consult note [facility]  [phone] [phone]  Patient Care Team: Virginia Crews, MD as PCP - General (Family Medicine) Beverly Gust, MD (Otolaryngology)   Name of the patient: April Stuart  [id number]  July 06, 1951   Date of visit: 12/28/18  Diagnosis- metastatic breast cancer with bone only mets  Chief complaint/ Reason for visit-routine follow-up of breast cancer on letrozole and Ibrance  Heme/Onc history: patient is a 68 year old female with a history of breast cancer back in 2008. She is status post neoadjuvant chemotherapy lumpectomy adjuvant radiation and 5 years of hormone therapy. I do not have the details of her breast cancer at this time since all her care was at [place].We did obtain some partial records which suggested that patient had a 2.3 x 1.8 x 2.3 cm mass in her right breast which was invading the pectoralis muscle there were 2 satellite masses as well as worrisome lymph node seen on MRI before she received neoadjuvant chemotherapy. Her final pathology showed residual tumor 2 separate foci 2 cm and 0.9 cm grade 2 with grade 1 respectively. No lymph node involvement. Presumably ER PR positive since patient received endocrine therapy for 5 years.  She otherwise has no significant comorbidities. She reports falling off her treadmill about 4 to 5 months ago and since then she has been having ongoing back pain which particularly got worse around Thanksgiving. She reports that her pain is relatively well controlled with ibuprofen when she is sitting in 1 place or laying down but she has uncontrolled pain at times when she ambulates. Also notes bilateral leg spasms. Denies any weakness or numbness in her extremities. Denies any bowel or bladder incontinence.  Patient underwent lumbar spine x-ray in December 2019 which showed L4 compression fracture and was referred to Dr. Rudene Christians for further  evaluation. She underwent MRI of her lumbar spine without contrast on 10/29/2017 which showed an L4 vertebral body fracture with 70% maximal height loss centrally. Complete replacement of normal bone marrow signal throughout the L4 vertebral body extending into the pedicles and facets bilaterally. The right L4 pedicle appears expanded with small volume extraosseous tumor not excluded in this region. There is bulging of vertebral body cortex diffusely including posteriorly into the spinal canal with small volume epidural tumor not excluded. 2 hypointense lesions located in L3 as well as 2.5 cm lesion in the posterior right ilium. Findings may reflect metastatic disease or multiple myeloma  PET CT scan showed multiple malignant osseous lesions including a pathological compression fracture at L4, lytic lesion involving L3 vertebral body and 1.7 cm lytic lesion in the anterior aspect of the right ilium. There was no evidence for any primary malignancy elsewhere. Patient decided not to pursue kyphoplasty and biopsy and instead went on to get CT-guided biopsy of the ilium which showed metastatic carcinoma compatible with breast primary. PO24 TO 90% POSITIVE,PR11 to 50% positiveand HER-2negative  She completed palliative radiation to her L-spine.  She was started on letrozole and Ibrance on 12/02/2018.   Interval history-she is tolerating letrozole and Ibrance well and reports no significant side effects.  She is unable to find a dentist locally due to insurance issues and is in the process of moving to [place] in 2 weeks time.  Her back pain is well controlled at this time  ECOG PS- 1 Pain scale- 0 Opioid associated constipation- no  Review of systems- Review of Systems  Constitutional: Negative for chills, fever, malaise/fatigue and weight loss.  HENT: Negative for congestion, ear discharge and nosebleeds.   Eyes: Negative for blurred vision.  Respiratory: Negative for cough, hemoptysis,  sputum production, shortness of breath and wheezing.   Cardiovascular: Negative for chest pain, palpitations, orthopnea and claudication.  Gastrointestinal: Negative for abdominal pain, blood in stool, constipation, diarrhea, heartburn, melena, nausea and vomiting.  Genitourinary: Negative for dysuria, flank pain, frequency, hematuria and urgency.  Musculoskeletal: Positive for back pain. Negative for joint pain and myalgias.  Skin: Negative for rash.  Neurological: Negative for dizziness, tingling, focal weakness, seizures, weakness and headaches.  Endo/Heme/Allergies: Does not bruise/bleed easily.  Psychiatric/Behavioral: Negative for depression and suicidal ideas. The patient does not have insomnia.        Allergies  Allergen Reactions  . Penicillins Shortness Of Breath and Swelling    Facial swelling  . Sulfa Antibiotics Swelling    Facial swelling  . Atorvastatin Other (See Comments)    myalgias  . Rosuvastatin Calcium Other (See Comments)    Myalgias      Past Medical History:  Diagnosis Date  . Breast cancer ([place]) 2008   R breast  . Personal history of chemotherapy 03/2007   right breast ca  . Personal history of radiation therapy 2008   right breast ca  . Thyroid nodule 2017     Past Surgical History:  Procedure Laterality Date  . APPENDECTOMY  1958  . BARTHOLIN GLAND CYST EXCISION  1978  . BREAST BIOPSY Right 03/19/2007   carcinoma  . BREAST BIOPSY Right 08/10/2013   rt breast 3:00 coil clip benign per pt done in [place], 9:00 wing clip benign per pt done in [place] Right ?   ribbon marker benign  . BREAST EXCISIONAL BIOPSY Left 2017   pt states benign lymph nodes removed from axilla  . BREAST LUMPECTOMY Right 08/11/2007   with axillary dissection.   Marland [place]   x [place]  . TUBAL LIGATION  1976    Social History   Socioeconomic History  . Marital status: Divorced    Spouse name: Not on file  .  Number of children: 3  . Years of education: Not on file  . Highest education level: Master's degree (e.g., MA, MS, MEng, MEd, MSW, MBA)  Occupational History  . 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 Financial resource strain: Not hard at all  . Food insecurity:    Worry: Never true    Inability: Never true  . Transportation needs:    Medical: No    Non-medical: No  Tobacco Use  . Smoking status: Former Smoker    Packs/day: 2.00    Years: 38.00    Pack years: 76.00    Types: Cigarettes    Last attempt to quit: 10/26/1997    Years since quitting: 21.1  . Smokeless tobacco: Never Used  Substance and Sexual Activity  . Alcohol use: Yes    Alcohol/week: 7.0 standard drinks    Types: 7 Glasses of wine per week  . Drug use: No  . Sexual activity: Never    Birth control/protection: Surgical  Lifestyle  . Physical activity:    Days per week: Not on file    Minutes per session: Not on file  . Stress: Not at all  Relationships  . Social connections:    Talks on phone: Not on file    Gets together:  Not on file    Attends religious service: Not on file    Active member of club or organization: Not on file    Attends meetings of clubs or organizations: Not on file    Relationship status: Not on file  . Intimate partner violence:    Fear of current or ex partner: Not on file    Emotionally abused: Not on file    Physically abused: Not on file    Forced sexual activity: Not on file  Other Topics Concern  . Not on file  Social History Narrative  . Not on file    Family History  Problem Relation Age of Onset  . Dementia Mother   . Stroke Mother 70  . Pancreatic cancer Father   . Prostate cancer Father   . HIV Brother   . Breast cancer Neg Hx   . Colon cancer Neg Hx      Current Outpatient Medications:  .  Calcium-Vitamin D-Vitamin K (VIACTIV CALCIUM PLUS D) 650-12.5-40 MG-MCG-MCG CHEW, Chew by mouth.,  Disp: , Rfl:  .  letrozole (FEMARA) 2.5 MG tablet, Take 1 tablet (2.5 mg total) by mouth daily., Disp: 30 tablet, Rfl: 2 .  Multiple Vitamin (MULTIVITAMIN) tablet, Take 1 tablet by mouth daily., Disp: , Rfl:  .  palbociclib (IBRANCE) 125 MG capsule, Take 1 capsule (125 mg total) by mouth daily. Take whole with food. Take for 21 days on, 7 days off, repeat every 28 days., Disp: 21 capsule, Rfl: 1 .  cyclobenzaprine (FLEXERIL) 5 MG tablet, Take 1 tablet (5 mg total) by mouth 3 (three) times daily as needed for muscle spasms. (Patient not taking: Reported on 12/27/2018), Disp: 30 tablet, Rfl: 1 .  lansoprazole (PREVACID) 30 MG capsule, Take 1 capsule (30 mg total) by mouth daily at 12 noon. (Patient not taking: Reported on 12/27/2018), Disp: 30 capsule, Rfl: 3 .  ondansetron (ZOFRAN) 8 MG tablet, Take 1 tablet (8 mg total) by mouth every 8 (eight) hours as needed for nausea or vomiting. (Patient not taking: Reported on 12/27/2018), Disp: 20 tablet, Rfl: 0  Physical exam:  Vitals:   12/27/18 1426  BP: 126/82  Pulse: 81  Resp: 18  Temp: 97.8 F (36.6 C)  TempSrc: Tympanic  Weight: 165 lb 11.2 oz (75.2 kg)   Physical Exam Constitutional:      General: She is not in acute distress. HENT:     Head: Normocephalic and atraumatic.  Eyes:     Pupils: Pupils are equal, round, and reactive to light.  Neck:     Musculoskeletal: Normal range of motion.  Cardiovascular:     Rate and Rhythm: Normal rate and regular rhythm.     Heart sounds: Normal heart sounds.  Pulmonary:     Effort: Pulmonary effort is normal.     Breath sounds: Normal breath sounds.  Abdominal:     General: Bowel sounds are normal.     Palpations: Abdomen is soft.  Skin:    General: Skin is warm and dry.  Neurological:     Mental Status: She is alert and oriented to person, place, and time.      CMP Latest Ref Rng & Units 12/27/2018  Glucose 70 - 99 mg/dL 113(H)  BUN 8 - 23 mg/dL 20  Creatinine 0.44 - 1.00 mg/dL 0.67  Sodium  135 - 145 mmol/L 138  Potassium 3.5 - 5.1 mmol/L 4.0  Chloride 98 - 111 mmol/L 106  CO2 22 - 32 mmol/L 27  Calcium 8.9 - 10.3 mg/dL 9.0  Total Protein 6.5 - 8.1 g/dL 6.7  Total Bilirubin 0.3 - 1.2 mg/dL 0.4  Alkaline Phos 38 - 126 U/L 59  AST 15 - 41 U/L 28  ALT 0 - 44 U/L 49(H)   CBC Latest Ref Rng & Units 12/27/2018  WBC 4.0 - 10.5 K/uL 1.6(L)  Hemoglobin 12.0 - 15.0 g/dL 12.6  Hematocrit 36.0 - 46.0 % 36.8  Platelets 150 - 400 K/uL 151     Assessment and plan- Patient is a 68 y.o. female with newly diagnosed metastatic breast cancer with bone only metastases.  History of breast cancer in 2008 status post surgery chemotherapy radiation treatment and 5 years of hormone therapy. She is here for routine follow-up of breast cancer on Ibrance and letrozole  Patient is tolerating letrozole well and will continue to take that daily.  She is supposed to start her cycle 2 of Ibrance on 12/31/2018.  Today her white count is low at 1.6 with an ANC of 0.9.  I have asked her to hold off on starting her [facility]lee Home for now and we will repeat her CBC with diff on 01/03/2019.  If her Angelina is greater than 1 at that time she can start taking Ibrance cycle 2 3 weeks on and one-week off.  Patient will need a repeat CBC with differential checked on 01/17/2019 and patient is in the process of deciding if she would like to postpone her move to [facility] by 1 more week to get that done here and receive her next 30-day supply of Ibrance before moving to [place].  I would like her to be seen at [facility] on 02/07/2019 before she starts her cycle 3 of Ibrance after getting her blood work checked over there  Patient is unable to find a dentist at this point and she will find one in [place].  She will need to be started on monthly Zometa or Xgeva after she establishes care at [facility]   Visit Diagnosis 1. Metastatic breast cancer ([place])   2. Drug-induced neutropenia ([facility])   3. High risk medication use      Dr. Randa Evens, MD, MPH [facility] at [facility] [phone] 12/28/2018 11:42 AM

## 2018-12-30 ENCOUNTER — Other Ambulatory Visit: Payer: Self-pay

## 2018-12-30 ENCOUNTER — Encounter: Payer: Self-pay | Admitting: Radiation Oncology

## 2018-12-30 ENCOUNTER — Ambulatory Visit
Admission: RE | Admit: 2018-12-30 | Discharge: 2018-12-30 | Disposition: A | Discharge status: Home / Self Care | Payer: Medicare Other | Source: Ambulatory Visit | Attending: Radiation Oncology | Admitting: Radiation Oncology

## 2018-12-30 VITALS — BP 144/71 | HR 87 | Temp 96.1°F | Resp 18 | Wt 165.2 lb

## 2018-12-30 DIAGNOSIS — Z170 Estrogen receptor positive status [ER+]: Secondary | ICD-10-CM | POA: Insufficient documentation

## 2018-12-30 DIAGNOSIS — C7951 Secondary malignant neoplasm of bone: Secondary | ICD-10-CM | POA: Insufficient documentation

## 2018-12-30 DIAGNOSIS — Z79811 Long term (current) use of aromatase inhibitors: Secondary | ICD-10-CM | POA: Insufficient documentation

## 2018-12-30 DIAGNOSIS — C50911 Malignant neoplasm of unspecified site of right female breast: Secondary | ICD-10-CM | POA: Diagnosis not present

## 2018-12-30 NOTE — Progress Notes (Signed)
Radiation Oncology Follow up Note  Name: April Stuart   Date:   12/30/2018 MRN:  [id number] DOB: 06-06-1951    This 68 y.o. female presents to the clinic today for one-month follow-up status post palliative radiation therapy to her L4 vertebral body in patient with stage IV breast cancer.  REFERRING PROVIDER: Virginia Crews, MD  HPI: patient is a 68 year old female now out 1 month having completed palliative radiation therapy to L4 vertebral body for stage IV breast cancer. Seen today in routine follow up she is doing well she's had a complete response as far as pain is concerned. She has been on Ibrance and is temporarily hold based on her blood counts..patient is also currently on letrozole.  COMPLICATIONS OF TREATMENT: none  FOLLOW UP COMPLIANCE: keeps appointments   PHYSICAL EXAM:  BP (!) 144/71 (BP Location: Left Arm, Patient Position: Sitting)   Pulse 87   Temp (!) 96.1 F (35.6 C) (Tympanic)   Resp 18   Wt 165 lb 3.8 oz (75 kg)   BMI 32.27 kg/m  Deep palpation of her spine does not elicit pain. Motor sensory and DTR levels are equal and symmetric in the upper and lower extremities.Well-developed well-nourished patient in NAD. HEENT reveals PERLA, EOMI, discs not visualized.  Oral cavity is clear. No oral mucosal lesions are identified. Neck is clear without evidence of cervical or supraclavicular adenopathy. Lungs are clear to A&P. Cardiac examination is essentially unremarkable with regular rate and rhythm without murmur rub or thrill. Abdomen is benign with no organomegaly or masses noted. Motor sensory and DTR levels are equal and symmetric in the upper and lower extremities. Cranial nerves II through XII are grossly intact. Proprioception is intact. No peripheral adenopathy or edema is identified. No motor or sensory levels are noted. Crude visual fields are within normal range.  RADIOLOGY RESULTS: no current films for review  PLAN: present time she's had a complete  response as far as pain is concerned in her back from palliative radiation therapy. I'm please were overall progress. I will turn follow-up care over to medical oncology. Patient's inform she is moving back to with family in [place] and will be followed at [facility]. We'll happily request all recordsas needed.  I would like to take this opportunity to thank you for allowing me to participate in the care of your patient.Noreene Filbert, MD

## 2019-01-03 ENCOUNTER — Other Ambulatory Visit: Payer: Self-pay

## 2019-01-03 ENCOUNTER — Encounter: Payer: Self-pay | Admitting: Oncology

## 2019-01-03 ENCOUNTER — Inpatient Hospital Stay: Payer: Medicare Other

## 2019-01-03 DIAGNOSIS — Z79899 Other long term (current) drug therapy: Secondary | ICD-10-CM | POA: Diagnosis not present

## 2019-01-03 DIAGNOSIS — C50919 Malignant neoplasm of unspecified site of unspecified female breast: Secondary | ICD-10-CM

## 2019-01-03 DIAGNOSIS — Z79811 Long term (current) use of aromatase inhibitors: Secondary | ICD-10-CM | POA: Diagnosis not present

## 2019-01-03 DIAGNOSIS — Z9221 Personal history of antineoplastic chemotherapy: Secondary | ICD-10-CM | POA: Diagnosis not present

## 2019-01-03 DIAGNOSIS — C7951 Secondary malignant neoplasm of bone: Secondary | ICD-10-CM | POA: Diagnosis not present

## 2019-01-03 DIAGNOSIS — Z87891 Personal history of nicotine dependence: Secondary | ICD-10-CM | POA: Diagnosis not present

## 2019-01-03 LAB — CBC WITH DIFFERENTIAL/PLATELET
Abs Immature Granulocytes: 0.01 10*3/uL (ref 0.00–0.07)
Basophils Absolute: 0.1 10*3/uL (ref 0.0–0.1)
Basophils Relative: 2 %
Eosinophils Absolute: 0 10*3/uL (ref 0.0–0.5)
Eosinophils Relative: 1 %
HEMATOCRIT: 38.9 % (ref 36.0–46.0)
HEMOGLOBIN: 13.2 g/dL (ref 12.0–15.0)
Immature Granulocytes: 0 %
LYMPHS PCT: 31 %
Lymphs Abs: 0.8 10*3/uL (ref 0.7–4.0)
MCH: 31.5 pg (ref 26.0–34.0)
MCHC: 33.9 g/dL (ref 30.0–36.0)
MCV: 92.8 fL (ref 80.0–100.0)
Monocytes Absolute: 0.6 10*3/uL (ref 0.1–1.0)
Monocytes Relative: 25 %
Neutro Abs: 1.1 10*3/uL — ABNORMAL LOW (ref 1.7–7.7)
Neutrophils Relative %: 41 %
Platelets: 217 10*3/uL (ref 150–400)
RBC: 4.19 MIL/uL (ref 3.87–5.11)
RDW: 15.5 % (ref 11.5–15.5)
WBC: 2.6 10*3/uL — ABNORMAL LOW (ref 4.0–10.5)
nRBC: 0 % (ref 0.0–0.2)

## 2019-01-03 NOTE — Progress Notes (Signed)
I called patient and let her know that her white count is fine to to start her next dose of Ibrance.  So she had sent in a request to see the doctor on 23 March because that is her last day before she moved to [place].  I change the appointments a little bit so we will see her on [phone] at [phone] for labs and see the doctor afterwards.  And agreeable to start her Leslee Home and about her appointment on March 23

## 2019-01-05 NOTE — Telephone Encounter (Signed)
I called patient and let her know that her labs were good and she can start Ibrance and as well as letting her know that I did add her on to see Dr. Janese Banks on 3/23 because that is the last day she will be here before moving and she was agreeable

## 2019-01-10 ENCOUNTER — Other Ambulatory Visit: Payer: Self-pay | Admitting: Oncology

## 2019-01-10 ENCOUNTER — Encounter: Payer: Self-pay | Admitting: Oncology

## 2019-01-10 DIAGNOSIS — C50919 Malignant neoplasm of unspecified site of unspecified female breast: Secondary | ICD-10-CM

## 2019-01-10 NOTE — Telephone Encounter (Signed)
CBC with Differential/Platelet  Order: [id number]  Status:  Final result  Visible to patient:  Yes (MyChart)  Next appt:  01/17/2019 at 09:15 AM in Oncology ([facility])  Dx:  Metastatic breast cancer ([place])   Ref Range & Units 7d ago (01/03/19) 2wk ago (12/27/18) 3wk ago (12/16/18) 59mo ago (12/09/18) 20mo ago (12/01/18) 63mo ago (11/24/18) 3mo ago (11/08/18)  WBC 4.0 - 10.5 K/uL 2.6Low   1.6Low   2.1Low   3.6Low   4.5  5.5  5.8   RBC 3.87 - 5.11 MIL/uL 4.19  4.04  4.44  4.55  4.82  4.99  4.81   Hemoglobin 12.0 - 15.0 g/dL 13.2  12.6  13.6  13.8  14.1  14.8  14.5   HCT 36.0 - 46.0 % 38.9  36.8  39.9  41.7  43.7  46.0  43.5   MCV 80.0 - 100.0 fL 92.8  91.1  89.9  91.6  90.7  92.2  90.4   MCH 26.0 - 34.0 pg 31.5  31.2  30.6  30.3  29.3  29.7  30.1   MCHC 30.0 - 36.0 g/dL 33.9  34.2  34.1  33.1  32.3  32.2  33.3   RDW 11.5 - 15.5 % 15.5  14.1  12.8  13.0  13.0  13.0  12.9   Platelets 150 - 400 K/uL 217  151  112Low   143Low   139Low   168  190   nRBC 0.0 - 0.2 % 0.0  0.0  0.0  0.0  0.0  0.0 CM 0.0   Neutrophils Relative % % 41  55  65  72  69   47   Neutro Abs 1.7 - 7.7 K/uL 1.1Low   0.9Low   1.4Low   2.6  3.1   2.8   Lymphocytes Relative % 31  32  25  18  16    38   Lymphs Abs 0.7 - 4.0 K/uL 0.8  0.5Low   0.5Low   0.6Low   0.7   2.2   Monocytes Relative % 25  10  4  3  6   10    Monocytes Absolute 0.1 - 1.0 K/uL 0.6  0.2  0.1  0.1  0.3   0.6   Eosinophils Relative % 1  2  6  6  9   3    Eosinophils Absolute 0.0 - 0.5 K/uL 0.0  0.0  0.1  0.2  0.4   0.2   Basophils Relative % 2  1  0  1  0   1   Basophils Absolute 0.0 - 0.1 K/uL 0.1  0.0  0.0  0.0  0.0   0.0   Immature Granulocytes % 0  0   0  0   1   Abs Immature Granulocytes 0.00 - 0.07 K/uL 0.01  0.00 CM 0.00 CM 0.01 CM 0.01 CM  0.03 CM  Comment: Performed at [facility], [address] [place], [place] [address]  WBC Morphology    MORPHOLOGY UNREMARKABLE  WBC MORPHOLOGY UNREMARKABLE      RBC Morphology    MORPHOLOGY NORMAL  RBC  MORPHOLOGY UNREMARKABLE       Smear Review    Normal platelet morphology CM Normal platelet morphology CM     Resulting Agency  [facility]      Specimen Collected: 01/03/19 11:39  Last Resulted: 01/03/19 11:53

## 2019-01-11 ENCOUNTER — Other Ambulatory Visit: Payer: Self-pay | Admitting: *Deleted

## 2019-01-11 ENCOUNTER — Telehealth: Payer: Self-pay | Admitting: *Deleted

## 2019-01-11 ENCOUNTER — Encounter: Payer: Self-pay | Admitting: Oncology

## 2019-01-11 ENCOUNTER — Other Ambulatory Visit: Payer: Self-pay

## 2019-01-11 ENCOUNTER — Inpatient Hospital Stay: Payer: Medicare Other

## 2019-01-11 DIAGNOSIS — Z87891 Personal history of nicotine dependence: Secondary | ICD-10-CM | POA: Diagnosis not present

## 2019-01-11 DIAGNOSIS — R300 Dysuria: Secondary | ICD-10-CM

## 2019-01-11 DIAGNOSIS — C7951 Secondary malignant neoplasm of bone: Secondary | ICD-10-CM | POA: Diagnosis not present

## 2019-01-11 DIAGNOSIS — Z9221 Personal history of antineoplastic chemotherapy: Secondary | ICD-10-CM | POA: Diagnosis not present

## 2019-01-11 DIAGNOSIS — Z79899 Other long term (current) drug therapy: Secondary | ICD-10-CM | POA: Diagnosis not present

## 2019-01-11 DIAGNOSIS — C50919 Malignant neoplasm of unspecified site of unspecified female breast: Secondary | ICD-10-CM | POA: Diagnosis not present

## 2019-01-11 DIAGNOSIS — Z79811 Long term (current) use of aromatase inhibitors: Secondary | ICD-10-CM | POA: Diagnosis not present

## 2019-01-11 NOTE — Telephone Encounter (Signed)
Called patient to let her know that if she would like to come over today we can do a UA and culture to see whether or not she has a UTI.  She is agreeable and will be here at 11 AM to have the urine tested

## 2019-01-12 ENCOUNTER — Other Ambulatory Visit: Payer: Self-pay | Admitting: *Deleted

## 2019-01-12 ENCOUNTER — Encounter: Payer: Self-pay | Admitting: *Deleted

## 2019-01-12 LAB — URINALYSIS, MICROSCOPIC (REFLEX)

## 2019-01-12 LAB — URINALYSIS, COMPLETE (UACMP) WITH MICROSCOPIC
Bilirubin Urine: NEGATIVE
Glucose, UA: NEGATIVE mg/dL
Ketones, ur: NEGATIVE mg/dL
Nitrite: POSITIVE — AB
Protein, ur: NEGATIVE mg/dL
Specific Gravity, Urine: 1.016 (ref 1.005–1.030)
pH: 6 (ref 5.0–8.0)

## 2019-01-12 MED ORDER — CIPROFLOXACIN HCL 500 MG PO TABS: 500.0000 mg | ORAL_TABLET | Freq: Two times a day (BID) | ORAL | 0 refills | Status: AC

## 2019-01-13 ENCOUNTER — Other Ambulatory Visit: Payer: Medicare Other

## 2019-01-14 ENCOUNTER — Other Ambulatory Visit: Payer: Self-pay

## 2019-01-14 LAB — URINE CULTURE: Culture: >=100000 — AB

## 2019-01-16 ENCOUNTER — Other Ambulatory Visit: Payer: Self-pay

## 2019-01-17 ENCOUNTER — Other Ambulatory Visit: Payer: Self-pay

## 2019-01-17 ENCOUNTER — Encounter: Payer: Self-pay | Admitting: Oncology

## 2019-01-17 ENCOUNTER — Inpatient Hospital Stay (HOSPITAL_BASED_OUTPATIENT_CLINIC_OR_DEPARTMENT_OTHER): Payer: Medicare Other | Admitting: Oncology

## 2019-01-17 ENCOUNTER — Inpatient Hospital Stay: Payer: Medicare Other

## 2019-01-17 VITALS — BP 129/76 | HR 87 | Temp 97.8°F | Resp 18 | Ht 60.0 in | Wt 164.1 lb

## 2019-01-17 DIAGNOSIS — Z79811 Long term (current) use of aromatase inhibitors: Secondary | ICD-10-CM | POA: Diagnosis not present

## 2019-01-17 DIAGNOSIS — C7951 Secondary malignant neoplasm of bone: Secondary | ICD-10-CM

## 2019-01-17 DIAGNOSIS — C50919 Malignant neoplasm of unspecified site of unspecified female breast: Secondary | ICD-10-CM | POA: Diagnosis not present

## 2019-01-17 DIAGNOSIS — Z87891 Personal history of nicotine dependence: Secondary | ICD-10-CM

## 2019-01-17 DIAGNOSIS — Z79899 Other long term (current) drug therapy: Secondary | ICD-10-CM

## 2019-01-17 DIAGNOSIS — D702 Other drug-induced agranulocytosis: Secondary | ICD-10-CM

## 2019-01-17 DIAGNOSIS — Z9221 Personal history of antineoplastic chemotherapy: Secondary | ICD-10-CM | POA: Diagnosis not present

## 2019-01-17 LAB — CBC WITH DIFFERENTIAL/PLATELET
Abs Immature Granulocytes: 0.01 10*3/uL (ref 0.00–0.07)
BASOS ABS: 0 10*3/uL (ref 0.0–0.1)
Basophils Relative: 1 %
EOS ABS: 0 10*3/uL (ref 0.0–0.5)
Eosinophils Relative: 2 %
HCT: 37.5 % (ref 36.0–46.0)
Hemoglobin: 12.7 g/dL (ref 12.0–15.0)
Immature Granulocytes: 1 %
Lymphocytes Relative: 36 %
Lymphs Abs: 0.6 10*3/uL — ABNORMAL LOW (ref 0.7–4.0)
MCH: 31.5 pg (ref 26.0–34.0)
MCHC: 33.9 g/dL (ref 30.0–36.0)
MCV: 93.1 fL (ref 80.0–100.0)
Monocytes Absolute: 0.1 10*3/uL (ref 0.1–1.0)
Monocytes Relative: 6 %
Neutro Abs: 1 10*3/uL — ABNORMAL LOW (ref 1.7–7.7)
Neutrophils Relative %: 54 %
Platelets: 170 10*3/uL (ref 150–400)
RBC: 4.03 MIL/uL (ref 3.87–5.11)
RDW: 15.6 % — ABNORMAL HIGH (ref 11.5–15.5)
WBC: 1.8 10*3/uL — ABNORMAL LOW (ref 4.0–10.5)
nRBC: 0 % (ref 0.0–0.2)

## 2019-01-17 MED ORDER — LETROZOLE 2.5 MG PO TABS: 2.5000 mg | ORAL_TABLET | Freq: Every day | ORAL | 0 refills | Status: AC

## 2019-01-17 NOTE — Progress Notes (Signed)
Pt doing good on ibrance and waiting in her sons to come here to [place] to help her move to [place]

## 2019-01-18 ENCOUNTER — Encounter: Payer: Self-pay | Admitting: Oncology

## 2019-01-18 LAB — CANCER ANTIGEN 15-3: CA 15-3: 14.1 U/mL (ref 0.0–25.0)

## 2019-01-18 LAB — CANCER ANTIGEN 27.29: CA 27.29: 18.3 U/mL (ref 0.0–38.6)

## 2019-01-19 ENCOUNTER — Encounter: Payer: Self-pay | Admitting: Oncology

## 2019-01-19 NOTE — Progress Notes (Signed)
Hematology/Oncology Consult note [facility]  Telephone:([phone] [phone]  Patient Care Team: Virginia Crews, MD as PCP - General (Family Medicine) Beverly Gust, MD (Otolaryngology)   Name of the patient: April Stuart  [id number]  05-03-1951   Date of visit: 01/19/19  Diagnosis- metastatic breast cancer with bone only mets  Chief complaint/ Reason for visit-routine follow-up of breast cancer on Ibrance  Heme/Onc history: patient is a 68 year old female with a history of breast cancer back in 2008. She is status post neoadjuvant chemotherapy lumpectomy adjuvant radiation and 5 years of hormone therapy. I do not have the details of her breast cancer at this time since all her care was at [place].We did obtain some partial records which suggested that patient had a 2.3 x 1.8 x 2.3 cm mass in her right breast which was invading the pectoralis muscle there were 2 satellite masses as well as worrisome lymph node seen on MRI before she received neoadjuvant chemotherapy. Her final pathology showed residual tumor 2 separate foci 2 cm and 0.9 cm grade 2 with grade 1 respectively. No lymph node involvement. Presumably ER PR positive since patient received endocrine therapy for 5 years.  She otherwise has no significant comorbidities. She reports falling off her treadmill about 4 to 5 months ago and since then she has been having ongoing back pain which particularly got worse around Thanksgiving. She reports that her pain is relatively well controlled with ibuprofen when she is sitting in 1 place or laying down but she has uncontrolled pain at times when she ambulates. Also notes bilateral leg spasms. Denies any weakness or numbness in her extremities. Denies any bowel or bladder incontinence.  Patient underwent lumbar spine x-ray in December 2019 which showed L4 compression fracture and was referred to Dr. Rudene Christians for further evaluation. She  underwent MRI of her lumbar spine without contrast on 10/29/2017 which showed an L4 vertebral body fracture with 70% maximal height loss centrally. Complete replacement of normal bone marrow signal throughout the L4 vertebral body extending into the pedicles and facets bilaterally. The right L4 pedicle appears expanded with small volume extraosseous tumor not excluded in this region. There is bulging of vertebral body cortex diffusely including posteriorly into the spinal canal with small volume epidural tumor not excluded. 2 hypointense lesions located in L3 as well as 2.5 cm lesion in the posterior right ilium. Findings may reflect metastatic disease or multiple myeloma  PET CT scan showed multiple malignant osseous lesions including a pathological compression fracture at L4, lytic lesion involving L3 vertebral body and 1.7 cm lytic lesion in the anterior aspect of the right ilium. There was no evidence for any primary malignancy elsewhere. Patient decided not to pursue kyphoplasty and biopsy and instead went on to get CT-guided biopsy of the ilium which showed metastatic carcinoma compatible with breast primary. YO45 TO 90% POSITIVE,PR11 to 50% positiveand HER-2negative  She completed palliative radiation to her L-spine. She was started on letrozole and Ibrance on 12/02/2018.    Interval history-back pain is well controlled and she is tolerating letrozole and Ibrance well.  She is due to complete her second cycle of Ibrance in 1 week.  Denies any fever cough cold or shortness of breath.  Denies any burning urination.  She did have some symptoms of UTI last week and urine culture did show E. coli and she was started on ciprofloxacin.  Those symptoms have resolved at this time  ECOG PS- 1 Pain scale-  0   Review of systems- Review of Systems  Constitutional: Negative for chills, fever, malaise/fatigue and weight loss.  HENT: Negative for congestion, ear discharge and nosebleeds.     Eyes: Negative for blurred vision.  Respiratory: Negative for cough, hemoptysis, sputum production, shortness of breath and wheezing.   Cardiovascular: Negative for chest pain, palpitations, orthopnea and claudication.  Gastrointestinal: Negative for abdominal pain, blood in stool, constipation, diarrhea, heartburn, melena, nausea and vomiting.  Genitourinary: Negative for dysuria, flank pain, frequency, hematuria and urgency.  Musculoskeletal: Negative for back pain, joint pain and myalgias.  Skin: Negative for rash.  Neurological: Negative for dizziness, tingling, focal weakness, seizures, weakness and headaches.  Endo/Heme/Allergies: Does not bruise/bleed easily.  Psychiatric/Behavioral: Negative for depression and suicidal ideas. The patient does not have insomnia.      Allergies  Allergen Reactions   Penicillins Shortness Of Breath and Swelling    Facial swelling   Sulfa Antibiotics Swelling    Facial swelling   Atorvastatin Other (See Comments)    myalgias   Rosuvastatin Calcium Other (See Comments)    Myalgias      Past Medical History:  Diagnosis Date   Breast cancer ([place]) 2008   R breast   Personal history of chemotherapy 03/2007   right breast ca   Personal history of radiation therapy 2008   right breast ca   Thyroid nodule 2017     Past Surgical History:  Procedure Laterality Date   Streeter   BREAST BIOPSY Right 03/19/2007   carcinoma   BREAST BIOPSY Right 08/10/2013   rt breast 3:00 coil clip benign per pt done in [place], 9:00 wing clip benign per pt done in [place] Right ?   ribbon marker benign   BREAST EXCISIONAL BIOPSY Left 2017   pt states benign lymph nodes removed from axilla   BREAST LUMPECTOMY Right 08/11/2007   with axillary dissection.    [place], 1975   x 3   [place]    Social History   Socioeconomic  History   Marital status: Divorced    Spouse name: Not on file   Number of children: 3   Years of education: Not on file   Highest education level: Master's degree (e.g., MA, MS, MEng, MEd, MSW, MBA)  Occupational History   Occupation: retired    Comment: Armed forces technical officer with [employer]  Social Designer, fashion/clothing strain: Not hard at all   Food insecurity:    Worry: Never true    Inability: Never true   Transportation needs:    Medical: No    Non-medical: No  Tobacco Use   Smoking status: Former Smoker    Packs/day: 2.00    Years: 38.00    Pack years: 76.00    Types: Cigarettes    Last attempt to quit: 10/26/1997    Years since quitting: 21.2   Smokeless tobacco: Never Used  Substance and Sexual Activity   Alcohol use: Not Currently    Comment: not currently took 1 beer yest.   Drug use: No   Sexual activity: Never    Birth control/protection: Surgical  Lifestyle   Physical activity:    Days per week: Not on file    Minutes per session: Not on file   Stress: Not at all  Relationships   Social connections:  Talks on phone: Not on file    Gets together: Not on file    Attends religious service: Not on file    Active member of club or organization: Not on file    Attends meetings of clubs or organizations: Not on file    Relationship status: Not on file   Intimate partner violence:    Fear of current or ex partner: Not on file    Emotionally abused: Not on file    Physically abused: Not on file    Forced sexual activity: Not on file  Other Topics Concern   Not on file  Social History Narrative   Not on file    Family History  Problem Relation Age of Onset   Dementia Mother    Stroke Mother 46   Pancreatic cancer Father    Prostate cancer Father    HIV Brother    Breast cancer Neg Hx    Colon cancer Neg Hx      Current Outpatient Medications:    Calcium-Vitamin D-Vitamin K (VIACTIV CALCIUM PLUS D)  650-12.5-40 MG-MCG-MCG CHEW, Chew by mouth., Disp: , Rfl:    ciprofloxacin (CIPRO) 500 MG tablet, Take 1 tablet (500 mg total) by mouth 2 (two) times daily., Disp: 10 tablet, Rfl: 0   cyclobenzaprine (FLEXERIL) 5 MG tablet, Take 1 tablet (5 mg total) by mouth 3 (three) times daily as needed for muscle spasms. (Patient not taking: Reported on 12/27/2018), Disp: 30 tablet, Rfl: 1   IBRANCE 125 MG capsule, TAKE 1 CAPSULE (125 MG TOTAL) BY MOUTH DAILY. TAKE WHOLE WITH FOOD. TAKE FOR 21 DAYS ON, 7 DAYS OFF, REPEAT EVERY 28 DAYS., Disp: 21 capsule, Rfl: 1   lansoprazole (PREVACID) 30 MG capsule, Take 1 capsule (30 mg total) by mouth daily at 12 noon. (Patient not taking: Reported on 12/27/2018), Disp: 30 capsule, Rfl: 3   letrozole (FEMARA) 2.5 MG tablet, Take 1 tablet (2.5 mg total) by mouth daily., Disp: 90 tablet, Rfl: 0   Multiple Vitamin (MULTIVITAMIN) tablet, Take 1 tablet by mouth daily., Disp: , Rfl:    ondansetron (ZOFRAN) 8 MG tablet, Take 1 tablet (8 mg total) by mouth every 8 (eight) hours as needed for nausea or vomiting. (Patient not taking: Reported on 12/27/2018), Disp: 20 tablet, Rfl: 0  Physical exam:  Vitals:   01/17/19 0941  BP: 129/76  Pulse: 87  Resp: 18  Temp: 97.8 F (36.6 C)  TempSrc: Tympanic  Weight: 164 lb 1.6 oz (74.4 kg)  Height: 5' (1.524 m)   Physical Exam Constitutional:      General: She is not in acute distress. HENT:     Head: Normocephalic and atraumatic.  Eyes:     Pupils: Pupils are equal, round, and reactive to light.  Neck:     Musculoskeletal: Normal range of motion.  Cardiovascular:     Rate and Rhythm: Normal rate and regular rhythm.     Heart sounds: Normal heart sounds.  Pulmonary:     Effort: Pulmonary effort is normal.     Breath sounds: Normal breath sounds.  Abdominal:     General: Bowel sounds are normal.     Palpations: Abdomen is soft.  Skin:    General: Skin is warm and dry.  Neurological:     Mental Status: She is alert and  oriented to person, place, and time.      CMP Latest Ref Rng & Units 12/27/2018  Glucose 70 - 99 mg/dL 113(H)  BUN 8 -  23 mg/dL 20  Creatinine 0.44 - 1.00 mg/dL 0.67  Sodium 135 - 145 mmol/L 138  Potassium 3.5 - 5.1 mmol/L 4.0  Chloride 98 - 111 mmol/L 106  CO2 22 - 32 mmol/L 27  Calcium 8.9 - 10.3 mg/dL 9.0  Total Protein 6.5 - 8.1 g/dL 6.7  Total Bilirubin 0.3 - 1.2 mg/dL 0.4  Alkaline Phos 38 - 126 U/L 59  AST 15 - 41 U/L 28  ALT 0 - 44 U/L 49(H)   CBC Latest Ref Rng & Units 01/17/2019  WBC 4.0 - 10.5 K/uL 1.8(L)  Hemoglobin 12.0 - 15.0 g/dL 12.7  Hematocrit 36.0 - 46.0 % 37.5  Platelets 150 - 400 K/uL 170    Assessment and plan- Patient is a 68 y.o. female with metastatic breast cancer and bony metastases.  She is currently on Ibrance and letrozole.  Patient is currently on day 14 of cycle 2 of Ibrance.  She has moderate neutropenia with an Angelina of 1.  She will complete her 1 more week of cycle 2 at this time.  Patient is in the process of moving to [facility] in 2 days time.  Her son is coming from [place] and her other son is coming from [place].  With her ongoing neutropenia I am concerned about getting her blood work checked at [facility] and starting her on third cycle of Ibrance without adequate supervision.  Also given ongoing COVID pandemic, she is at a high risk of complications from neutropenia.  I have therefore recommended that she should hold off on taking Ibrance until she is seen at [facility] on 07 February 2019.  They can check her blood work at that time and decide if she can continue on 125 mg of Ibrance or if they need to cut it down 200 mg.  It would be okay for her to take a break from [place] for 4 to 6 weeks until she settles down in [place].  She will continue to take letrozole at this time.  Patient verbalized understanding.  She is aware that she needs to take neutropenic precautions and especially more so during this COVID pandemic.  Since her son is flying from  [place] I have asked her to stay away from him as much as possible even if he is asymptomatic.  She should wear a mask during her process of travel.  Also emphasize hand hygiene and not to touch her face.  She will need to get in touch with a healthcare provider when she goes to [place] if she experiences any of these symptoms including all but not limited to fever, cough, shortness of breath myalgias or sore throat.  If her move to [facility] is delayed for any reason she will get in touch with [place] and I will plan to arrange for a repeat blood work check in 1 to 2 weeks time   Visit Diagnosis 1. Metastatic breast cancer ([place])   2. High risk medication use   3. Drug-induced neutropenia ([place])      Dr. Randa Evens, MD, MPH [facility] at [facility] [phone] 01/19/2019 8:43 AM

## 2019-01-26 IMAGING — CR DG LUMBAR SPINE COMPLETE 4+V
1 series · 5 of 5 positions shown · non-contrast
Comparison: None.

CLINICAL DATA: 67-year-old female fell off treadmill 3-4 months
ago. Right-sided lower back pain since. Recent pain extending down
left leg for the past month. Initial encounter.

EXAM:
LUMBAR SPINE - COMPLETE 4+ VIEW

[Series 1: dg lumbar spine complete 4 +v · 0.14mm/px · 5 of 5 slices shown]
[im 1/5]
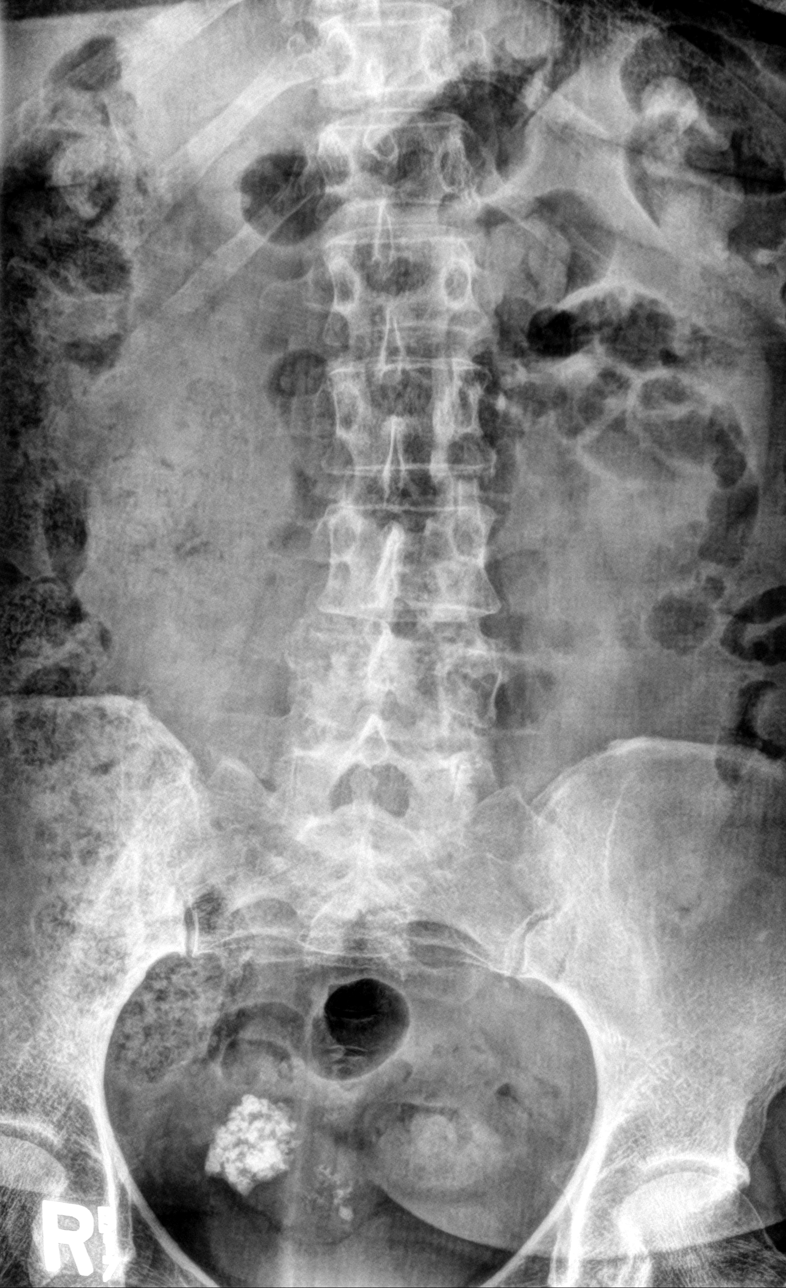
[im 2/5]
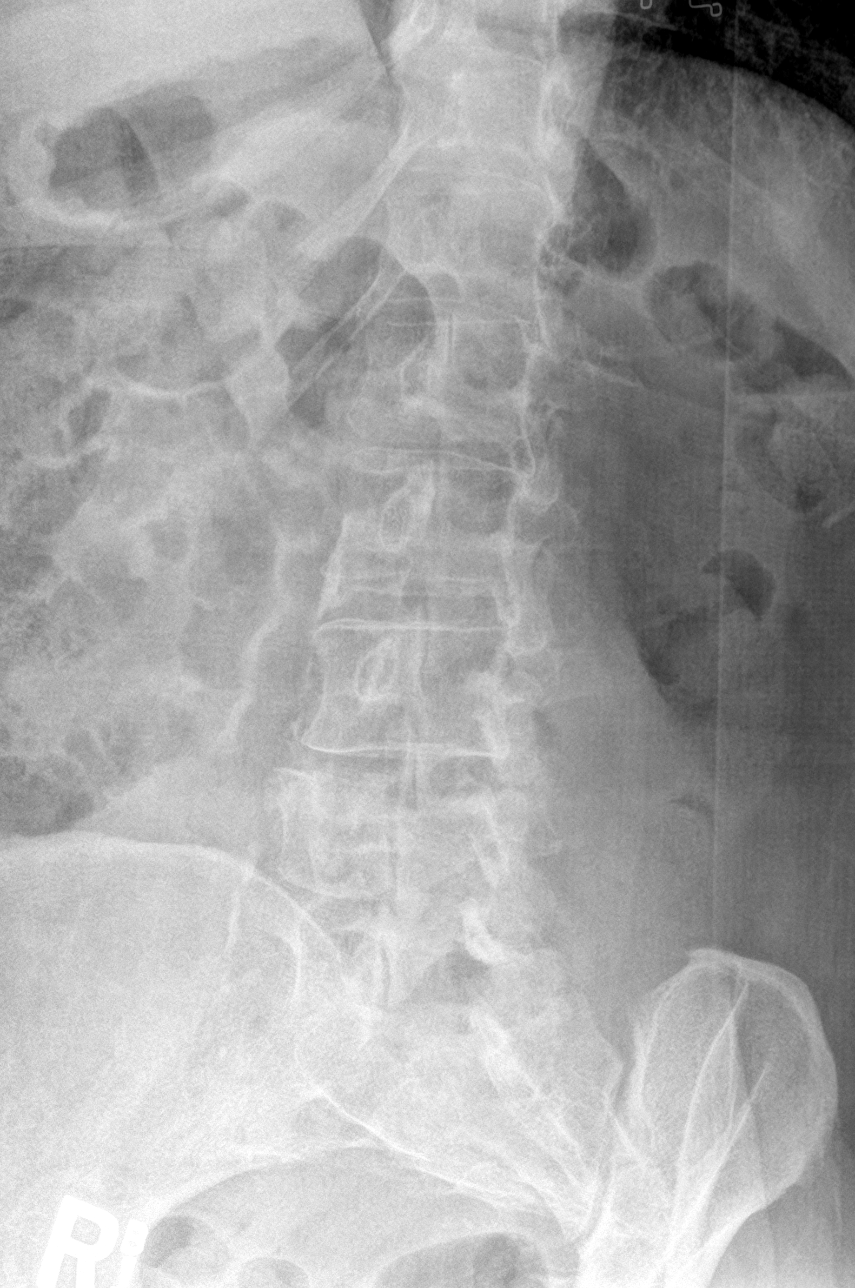
[im 3/5]
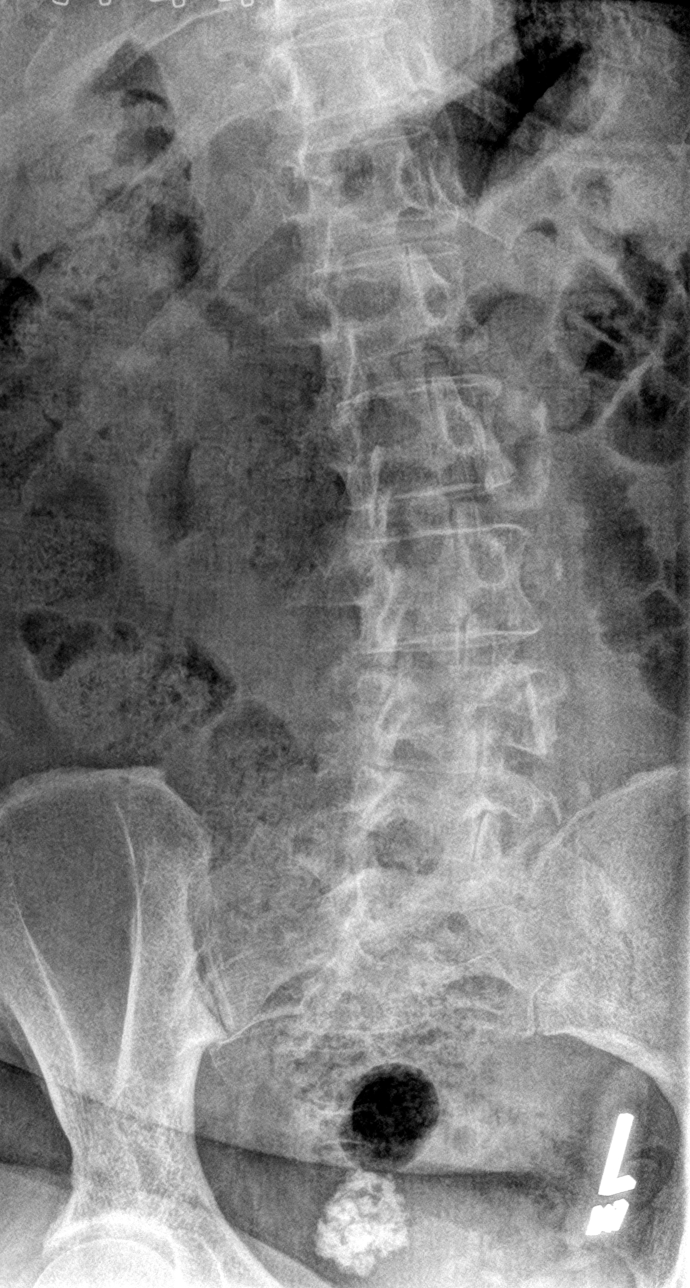
[im 4/5]
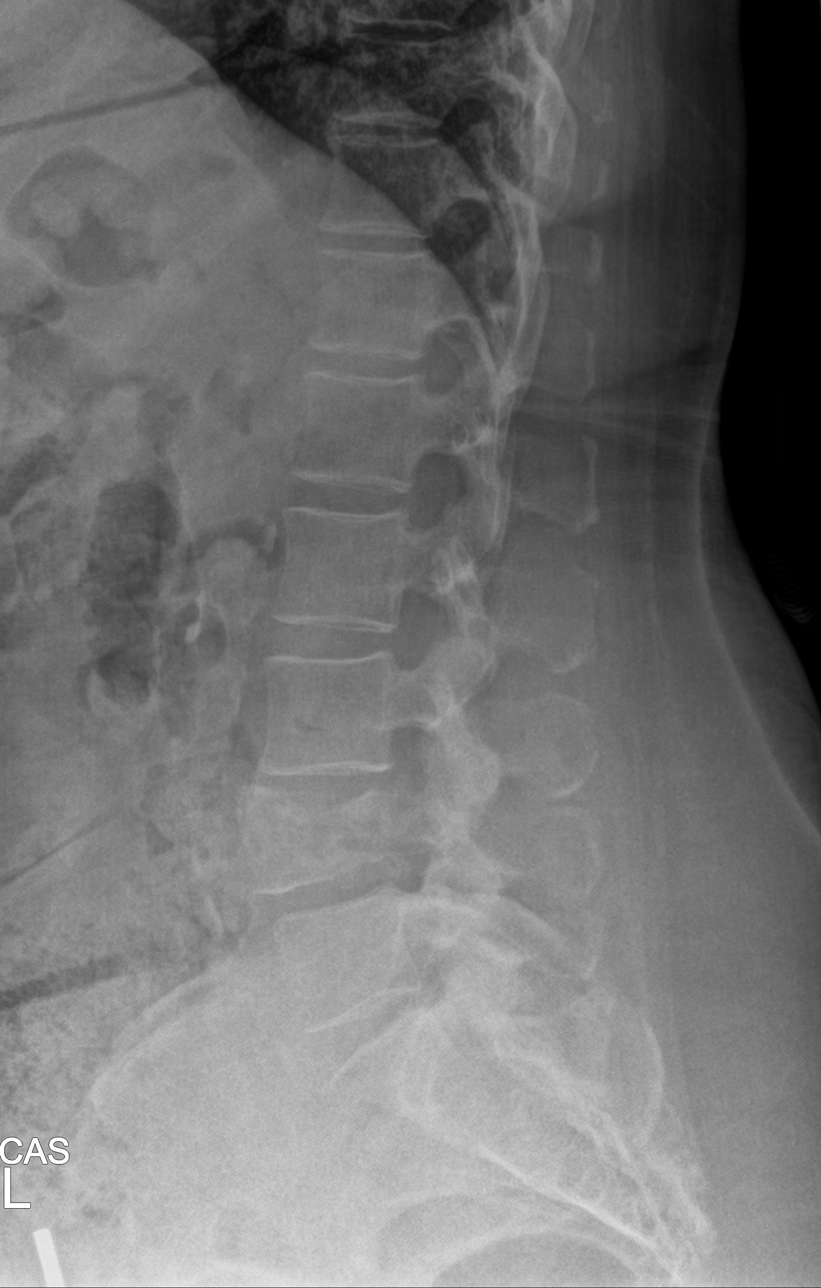
[im 5/5]
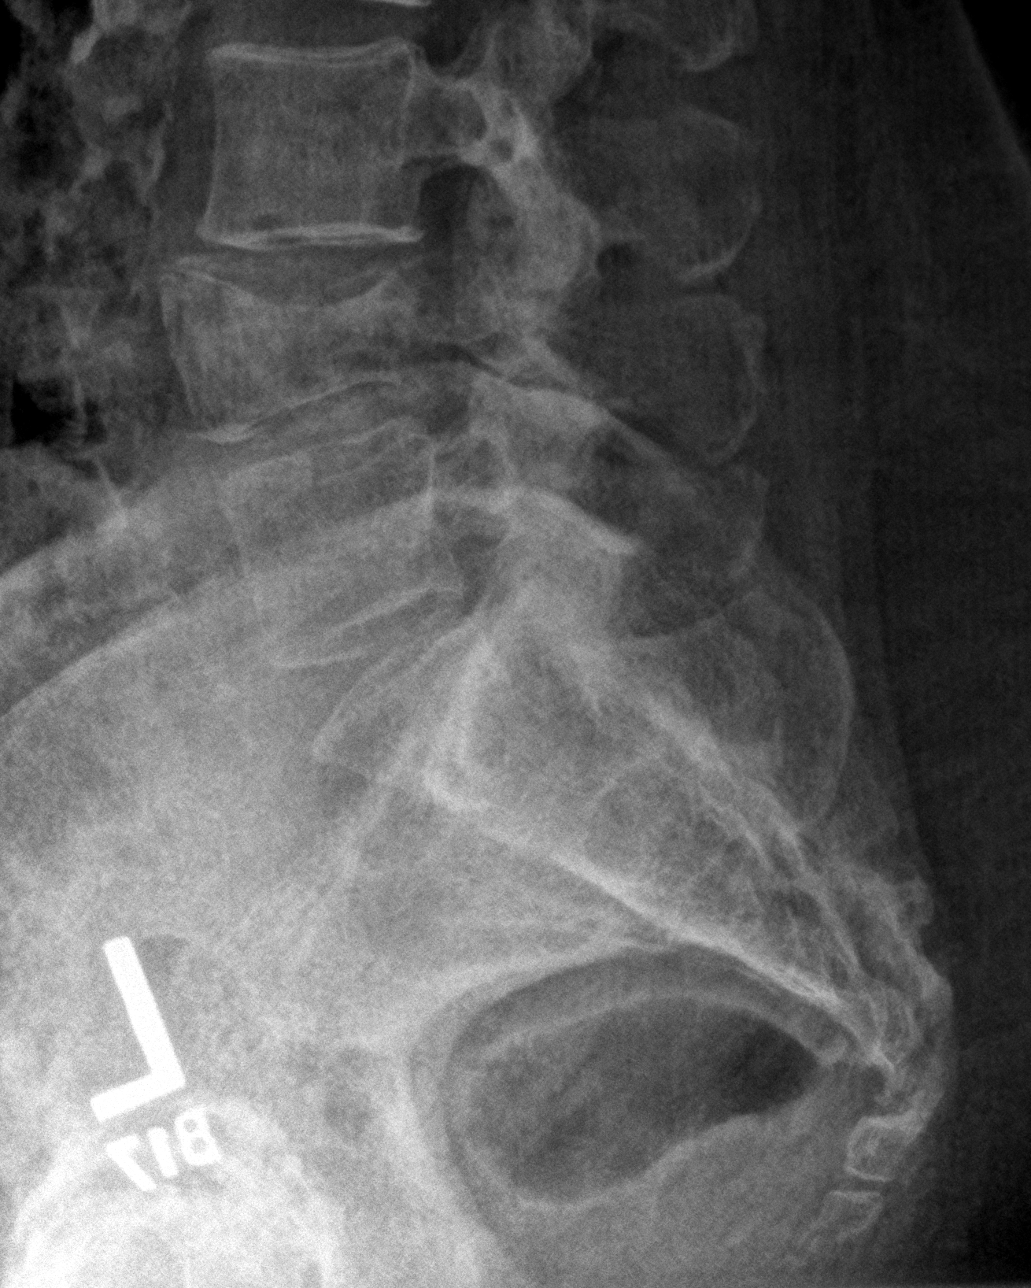

[5 of 5 positions shown; findings below may reference images not displayed]

FINDINGS: Scoliosis lumbar spine convex left.

L4 compression fracture with 40-50% loss of height and retropulsion
posterior inferior aspect. No other compression fracture or pars
defect noted.

Moderate L3-4 and mild to moderate L4-5 disc space narrowing. Mild
narrowing posterior aspect L5-S1. L5-S1 facet degenerative changes.

Vascular calcifications. Calcifications within the pelvis consistent
with calcified uterine fibroid.
IMPRESSION: 1. L4 compression fracture with 40-50% loss of height and
retropulsion posterior inferior aspect. CT or MR Han-Sol be considered
for further delineation.
2. Moderate L3-4 and mild to moderate L4-5 disc space narrowing.
3. Mild narrowing posterior aspect L5-S1 disc space. L5-S1 facet
degenerative changes.
4.  Aortic Atherosclerosis ([id number]).
5. Calcified uterine fibroid suspected.

These results will be called to the ordering clinician or
representative by the Radiologist Assistant, and communication
documented in the PACS or zVision Dashboard.

## 2019-02-07 DIAGNOSIS — R52 Pain, unspecified: Secondary | ICD-10-CM | POA: Diagnosis not present

## 2019-02-07 DIAGNOSIS — E079 Disorder of thyroid, unspecified: Secondary | ICD-10-CM | POA: Diagnosis not present

## 2019-02-07 DIAGNOSIS — Z923 Personal history of irradiation: Secondary | ICD-10-CM | POA: Diagnosis not present

## 2019-02-07 DIAGNOSIS — Z800 Family history of malignant neoplasm of digestive organs: Secondary | ICD-10-CM | POA: Diagnosis not present

## 2019-02-07 DIAGNOSIS — R740 Nonspecific elevation of levels of transaminase and lactic acid dehydrogenase [LDH]: Secondary | ICD-10-CM | POA: Diagnosis not present

## 2019-02-07 DIAGNOSIS — Z8042 Family history of malignant neoplasm of prostate: Secondary | ICD-10-CM | POA: Diagnosis not present

## 2019-02-07 DIAGNOSIS — Z79899 Other long term (current) drug therapy: Secondary | ICD-10-CM | POA: Diagnosis not present

## 2019-02-07 DIAGNOSIS — C7951 Secondary malignant neoplasm of bone: Secondary | ICD-10-CM | POA: Diagnosis not present

## 2019-02-07 DIAGNOSIS — D709 Neutropenia, unspecified: Secondary | ICD-10-CM | POA: Diagnosis not present

## 2019-02-07 DIAGNOSIS — Z170 Estrogen receptor positive status [ER+]: Secondary | ICD-10-CM | POA: Diagnosis not present

## 2019-02-07 DIAGNOSIS — Z79811 Long term (current) use of aromatase inhibitors: Secondary | ICD-10-CM | POA: Diagnosis not present

## 2019-02-07 DIAGNOSIS — Z87891 Personal history of nicotine dependence: Secondary | ICD-10-CM | POA: Diagnosis not present

## 2019-02-07 DIAGNOSIS — C50911 Malignant neoplasm of unspecified site of right female breast: Secondary | ICD-10-CM | POA: Diagnosis not present

## 2019-02-08 ENCOUNTER — Ambulatory Visit: Payer: Medicare Other | Admitting: Family Medicine

## 2019-02-21 DIAGNOSIS — C50919 Malignant neoplasm of unspecified site of unspecified female breast: Secondary | ICD-10-CM | POA: Diagnosis not present

## 2019-02-21 DIAGNOSIS — Z79811 Long term (current) use of aromatase inhibitors: Secondary | ICD-10-CM | POA: Diagnosis not present

## 2019-02-21 DIAGNOSIS — Z923 Personal history of irradiation: Secondary | ICD-10-CM | POA: Diagnosis not present

## 2019-02-21 DIAGNOSIS — G893 Neoplasm related pain (acute) (chronic): Secondary | ICD-10-CM | POA: Diagnosis not present

## 2019-02-21 DIAGNOSIS — Z880 Allergy status to penicillin: Secondary | ICD-10-CM | POA: Diagnosis not present

## 2019-02-21 DIAGNOSIS — M79601 Pain in right arm: Secondary | ICD-10-CM | POA: Diagnosis not present

## 2019-02-21 DIAGNOSIS — Z882 Allergy status to sulfonamides status: Secondary | ICD-10-CM | POA: Diagnosis not present

## 2019-02-21 DIAGNOSIS — K5901 Slow transit constipation: Secondary | ICD-10-CM | POA: Diagnosis not present

## 2019-02-21 DIAGNOSIS — M545 Low back pain: Secondary | ICD-10-CM | POA: Diagnosis not present

## 2019-02-21 DIAGNOSIS — C7951 Secondary malignant neoplasm of bone: Secondary | ICD-10-CM | POA: Diagnosis not present

## 2019-02-21 DIAGNOSIS — M79602 Pain in left arm: Secondary | ICD-10-CM | POA: Diagnosis not present

## 2019-02-21 DIAGNOSIS — Z170 Estrogen receptor positive status [ER+]: Secondary | ICD-10-CM | POA: Diagnosis not present

## 2019-02-21 DIAGNOSIS — C50911 Malignant neoplasm of unspecified site of right female breast: Secondary | ICD-10-CM | POA: Diagnosis not present

## 2019-02-21 DIAGNOSIS — M79605 Pain in left leg: Secondary | ICD-10-CM | POA: Diagnosis not present

## 2019-02-21 DIAGNOSIS — Z79899 Other long term (current) drug therapy: Secondary | ICD-10-CM | POA: Diagnosis not present

## 2019-02-21 DIAGNOSIS — M79604 Pain in right leg: Secondary | ICD-10-CM | POA: Diagnosis not present

## 2019-02-28 DIAGNOSIS — C7951 Secondary malignant neoplasm of bone: Secondary | ICD-10-CM | POA: Diagnosis not present

## 2019-02-28 DIAGNOSIS — Z853 Personal history of malignant neoplasm of breast: Secondary | ICD-10-CM | POA: Diagnosis not present

## 2019-02-28 DIAGNOSIS — Z79811 Long term (current) use of aromatase inhibitors: Secondary | ICD-10-CM | POA: Diagnosis not present

## 2019-02-28 DIAGNOSIS — Z79899 Other long term (current) drug therapy: Secondary | ICD-10-CM | POA: Diagnosis not present

## 2019-03-07 DIAGNOSIS — Z853 Personal history of malignant neoplasm of breast: Secondary | ICD-10-CM | POA: Diagnosis not present

## 2019-03-07 DIAGNOSIS — Z79811 Long term (current) use of aromatase inhibitors: Secondary | ICD-10-CM | POA: Diagnosis not present

## 2019-03-07 DIAGNOSIS — C7951 Secondary malignant neoplasm of bone: Secondary | ICD-10-CM | POA: Diagnosis not present

## 2019-03-07 DIAGNOSIS — Z79899 Other long term (current) drug therapy: Secondary | ICD-10-CM | POA: Diagnosis not present

## 2019-03-09 ENCOUNTER — Ambulatory Visit: Payer: Medicare Other | Attending: Unknown Physician Specialty

## 2019-03-12 ENCOUNTER — Encounter: Payer: Self-pay | Admitting: Family Medicine

## 2019-03-16 IMAGING — MG DIGITAL DIAGNOSTIC BILATERAL MAMMOGRAM WITH TOMO AND CAD
6 of 12 series · 6 of 36 positions shown · non-contrast
Comparison: Previous exam(s).

CLINICAL DATA: History of treated right breast cancer, status post
lumpectomy radiation, chemo and radiation therapy in 9774.The
patient was recently diagnosed with metastatic osseous disease from
breast primary malignancy.

EXAM:
DIGITAL DIAGNOSTIC BILATERAL MAMMOGRAM WITH CAD AND TOMO

[L CC synth-2D]
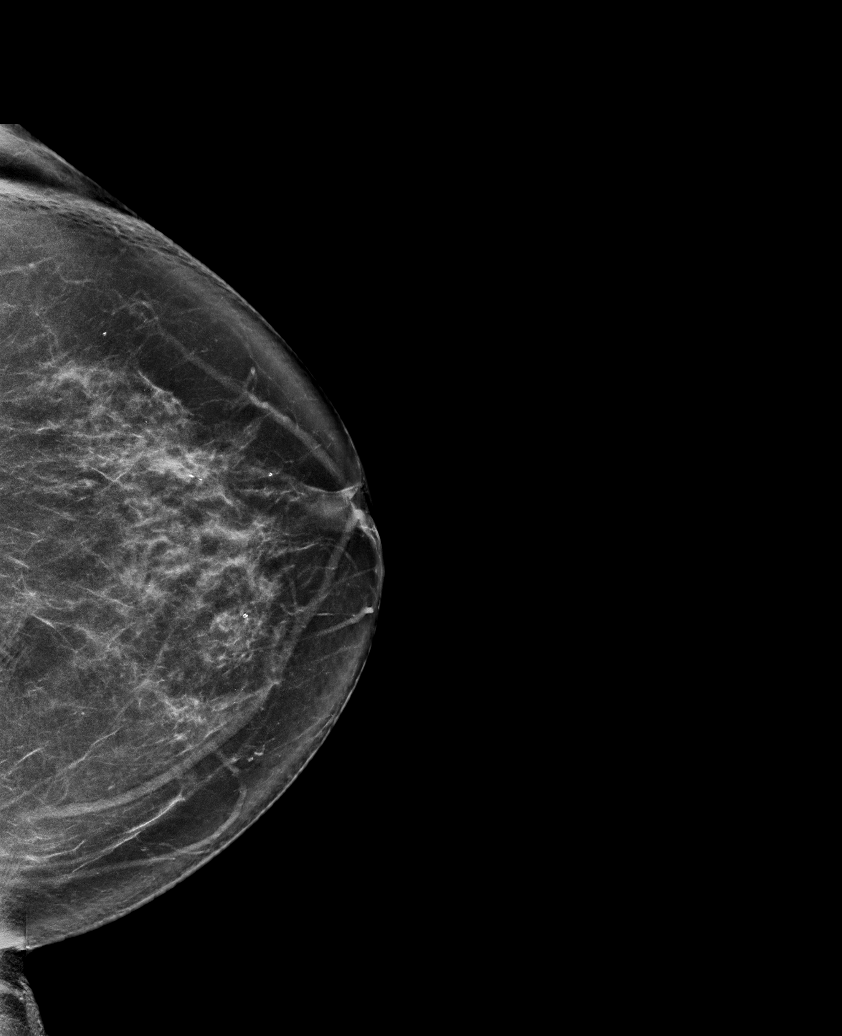

[L MLO synth-2D]
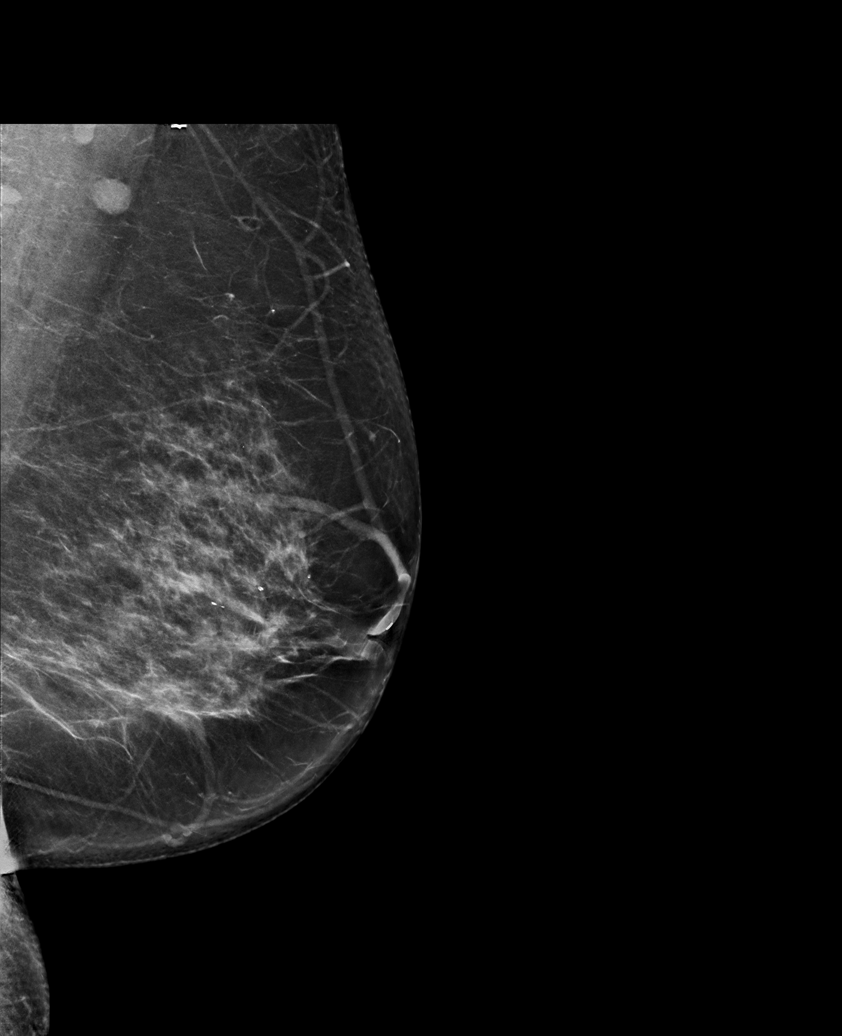

[R CC synth-2D]
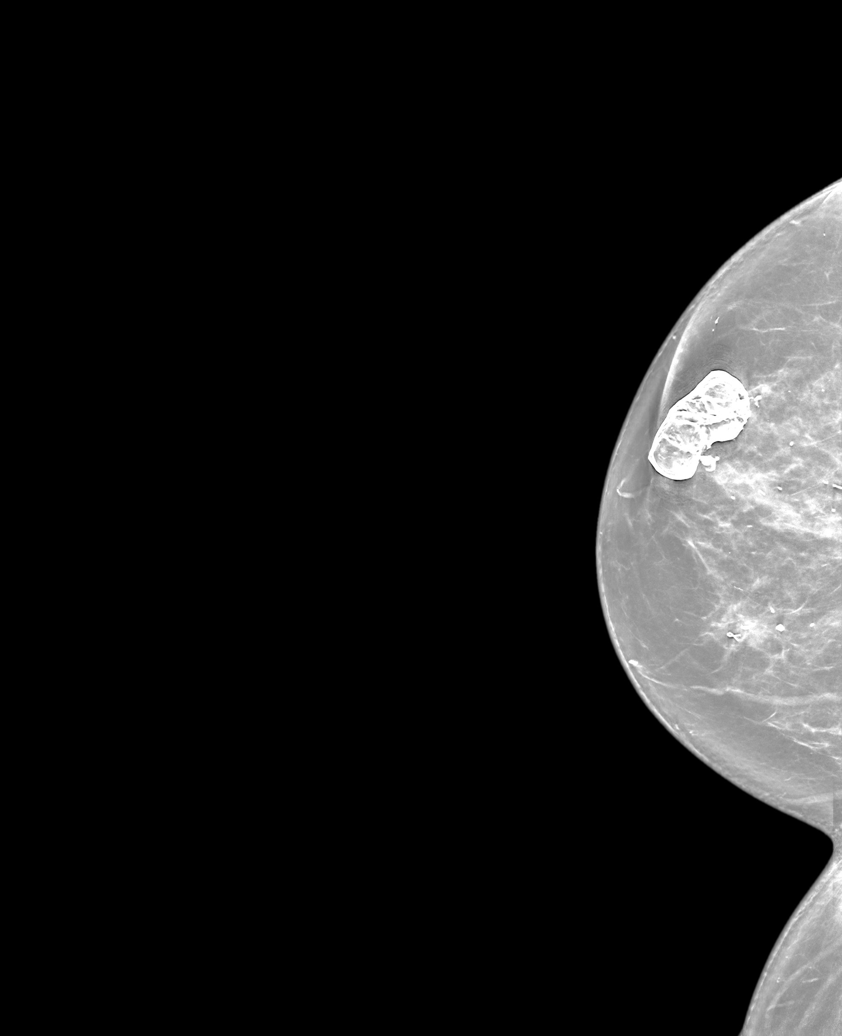

[R MLO synth-2D]
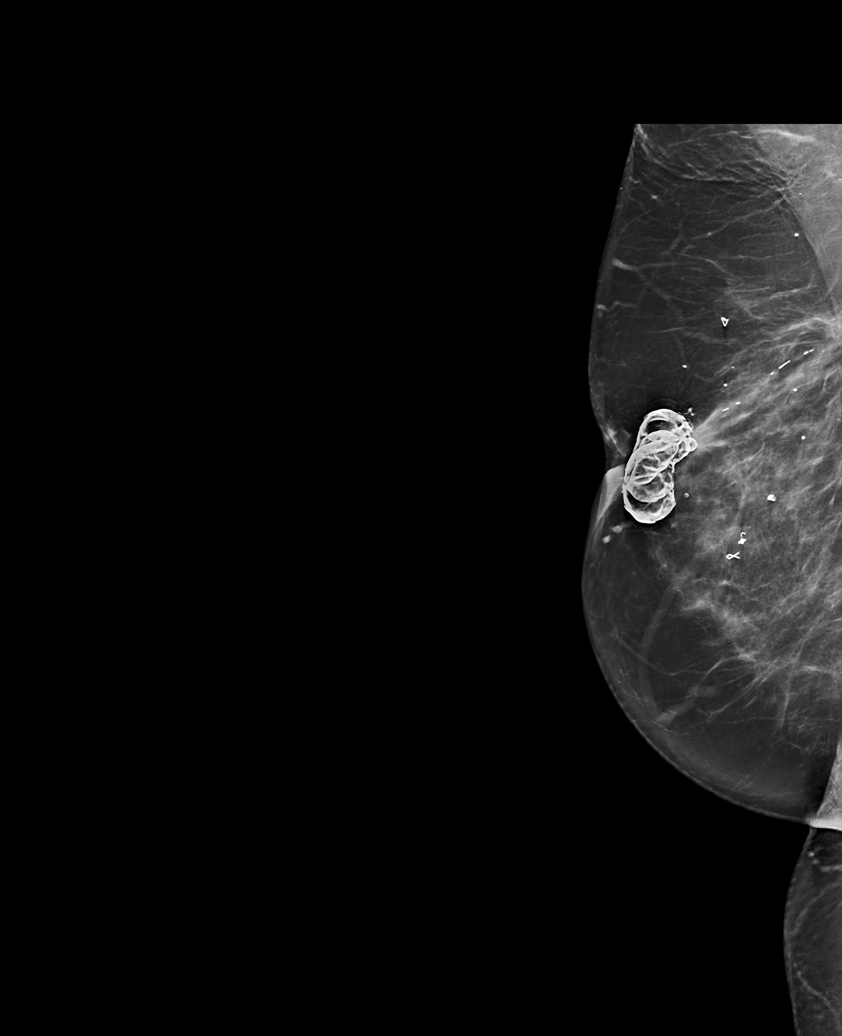

[L XCCL synth-2D]
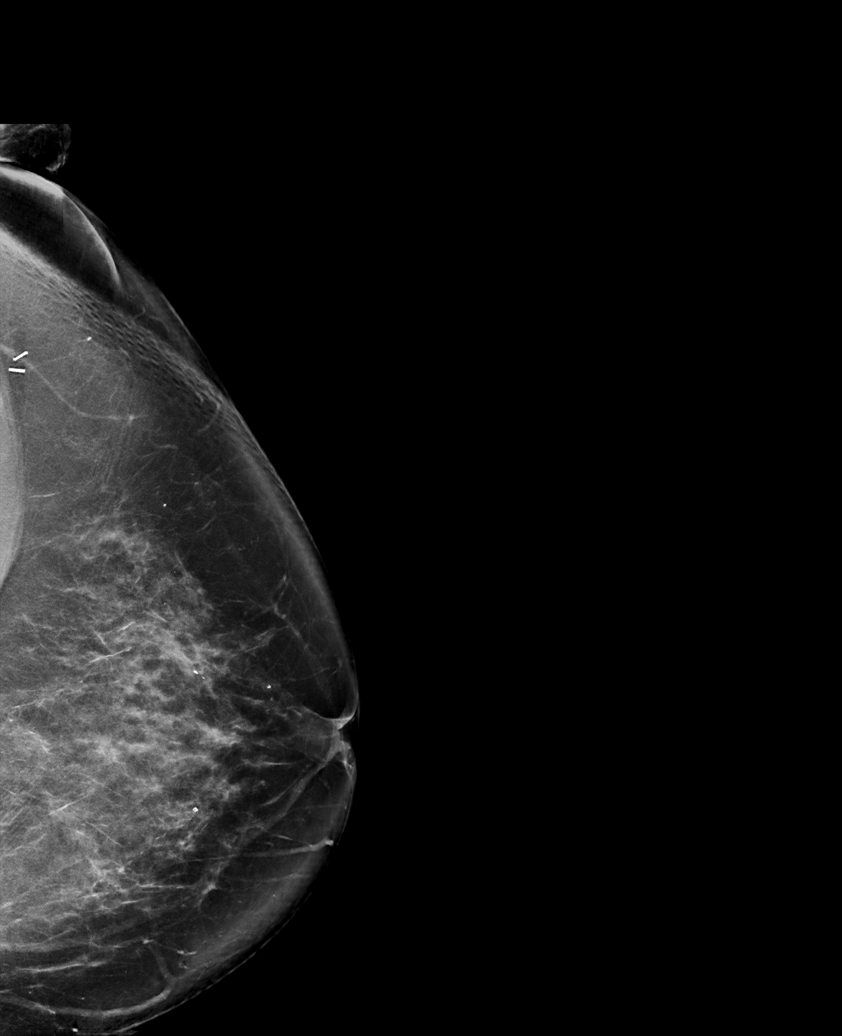

[R XCCL synth-2D]
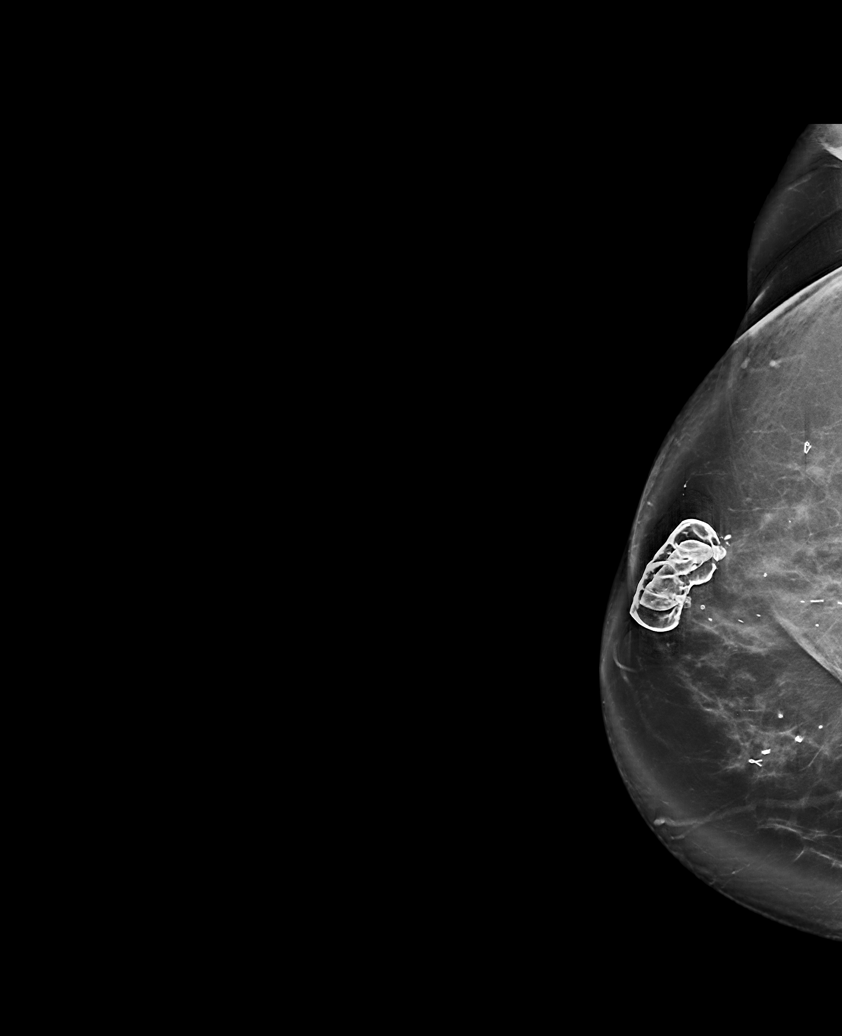

[6 of 36 positions shown; findings below may reference images not displayed]

ACR Breast Density Category c: The breast tissue is heterogeneously
dense, which may obscure small masses.
FINDINGS: Mammographically, there are no suspicious masses, areas of
nonsurgical architectural distortion or microcalcifications in
either breast. Stable postsurgical changes are seen in the right
breast and left axilla.

Mammographic images were processed with CAD.
IMPRESSION: No mammographic evidence of malignancy in either breast, status post
right lumpectomy.

RECOMMENDATION:
Screening mammogram in one year.(Code:[id number])

I have discussed the findings and recommendations with the patient.
Results were also provided in writing at the conclusion of the
visit. If applicable, a reminder letter will be sent to the patient
regarding the next appointment.

BI-RADS CATEGORY  2: Benign.

## 2019-03-17 DIAGNOSIS — G893 Neoplasm related pain (acute) (chronic): Secondary | ICD-10-CM | POA: Diagnosis not present

## 2019-03-17 DIAGNOSIS — R530 Neoplastic (malignant) related fatigue: Secondary | ICD-10-CM | POA: Diagnosis not present

## 2019-03-17 DIAGNOSIS — C50919 Malignant neoplasm of unspecified site of unspecified female breast: Secondary | ICD-10-CM | POA: Diagnosis not present

## 2019-03-17 DIAGNOSIS — Z170 Estrogen receptor positive status [ER+]: Secondary | ICD-10-CM | POA: Diagnosis not present

## 2019-03-28 DIAGNOSIS — Z9049 Acquired absence of other specified parts of digestive tract: Secondary | ICD-10-CM | POA: Diagnosis not present

## 2019-03-28 DIAGNOSIS — Z79899 Other long term (current) drug therapy: Secondary | ICD-10-CM | POA: Diagnosis not present

## 2019-03-28 DIAGNOSIS — Z79811 Long term (current) use of aromatase inhibitors: Secondary | ICD-10-CM | POA: Diagnosis not present

## 2019-03-28 DIAGNOSIS — C50911 Malignant neoplasm of unspecified site of right female breast: Secondary | ICD-10-CM | POA: Diagnosis not present

## 2019-03-28 DIAGNOSIS — Z8639 Personal history of other endocrine, nutritional and metabolic disease: Secondary | ICD-10-CM | POA: Diagnosis not present

## 2019-03-28 DIAGNOSIS — Z170 Estrogen receptor positive status [ER+]: Secondary | ICD-10-CM | POA: Diagnosis not present

## 2019-03-28 DIAGNOSIS — C7951 Secondary malignant neoplasm of bone: Secondary | ICD-10-CM | POA: Diagnosis not present

## 2019-03-28 DIAGNOSIS — D709 Neutropenia, unspecified: Secondary | ICD-10-CM | POA: Diagnosis not present

## 2019-03-28 DIAGNOSIS — Z882 Allergy status to sulfonamides status: Secondary | ICD-10-CM | POA: Diagnosis not present

## 2019-03-28 DIAGNOSIS — Z923 Personal history of irradiation: Secondary | ICD-10-CM | POA: Diagnosis not present

## 2019-03-28 DIAGNOSIS — Z880 Allergy status to penicillin: Secondary | ICD-10-CM | POA: Diagnosis not present

## 2019-03-28 DIAGNOSIS — Z800 Family history of malignant neoplasm of digestive organs: Secondary | ICD-10-CM | POA: Diagnosis not present

## 2019-04-11 DIAGNOSIS — C50911 Malignant neoplasm of unspecified site of right female breast: Secondary | ICD-10-CM | POA: Diagnosis not present

## 2019-04-11 DIAGNOSIS — Z79811 Long term (current) use of aromatase inhibitors: Secondary | ICD-10-CM | POA: Diagnosis not present

## 2019-04-11 DIAGNOSIS — C7951 Secondary malignant neoplasm of bone: Secondary | ICD-10-CM | POA: Diagnosis not present

## 2019-04-11 DIAGNOSIS — Z170 Estrogen receptor positive status [ER+]: Secondary | ICD-10-CM | POA: Diagnosis not present

## 2019-04-11 DIAGNOSIS — Z79899 Other long term (current) drug therapy: Secondary | ICD-10-CM | POA: Diagnosis not present

## 2019-04-18 DIAGNOSIS — Z853 Personal history of malignant neoplasm of breast: Secondary | ICD-10-CM | POA: Diagnosis not present

## 2019-04-18 DIAGNOSIS — Z79811 Long term (current) use of aromatase inhibitors: Secondary | ICD-10-CM | POA: Diagnosis not present

## 2019-04-18 DIAGNOSIS — C7951 Secondary malignant neoplasm of bone: Secondary | ICD-10-CM | POA: Diagnosis not present

## 2019-04-18 DIAGNOSIS — Z880 Allergy status to penicillin: Secondary | ICD-10-CM | POA: Diagnosis not present

## 2019-04-18 DIAGNOSIS — Z9011 Acquired absence of right breast and nipple: Secondary | ICD-10-CM | POA: Diagnosis not present

## 2019-04-18 DIAGNOSIS — Z79899 Other long term (current) drug therapy: Secondary | ICD-10-CM | POA: Diagnosis not present

## 2019-04-18 DIAGNOSIS — Z9049 Acquired absence of other specified parts of digestive tract: Secondary | ICD-10-CM | POA: Diagnosis not present

## 2019-04-18 DIAGNOSIS — Z882 Allergy status to sulfonamides status: Secondary | ICD-10-CM | POA: Diagnosis not present

## 2019-04-20 DIAGNOSIS — C7951 Secondary malignant neoplasm of bone: Secondary | ICD-10-CM | POA: Diagnosis not present

## 2019-04-20 DIAGNOSIS — G893 Neoplasm related pain (acute) (chronic): Secondary | ICD-10-CM | POA: Diagnosis not present

## 2019-04-20 DIAGNOSIS — C50919 Malignant neoplasm of unspecified site of unspecified female breast: Secondary | ICD-10-CM | POA: Diagnosis not present

## 2019-04-22 DIAGNOSIS — Z79811 Long term (current) use of aromatase inhibitors: Secondary | ICD-10-CM | POA: Diagnosis not present

## 2019-04-22 DIAGNOSIS — C50911 Malignant neoplasm of unspecified site of right female breast: Secondary | ICD-10-CM | POA: Diagnosis not present

## 2019-04-22 DIAGNOSIS — C7951 Secondary malignant neoplasm of bone: Secondary | ICD-10-CM | POA: Diagnosis not present

## 2019-04-22 DIAGNOSIS — Z79899 Other long term (current) drug therapy: Secondary | ICD-10-CM | POA: Diagnosis not present

## 2019-04-22 DIAGNOSIS — Z170 Estrogen receptor positive status [ER+]: Secondary | ICD-10-CM | POA: Diagnosis not present

## 2019-04-22 DIAGNOSIS — R918 Other nonspecific abnormal finding of lung field: Secondary | ICD-10-CM | POA: Diagnosis not present

## 2019-04-25 DIAGNOSIS — C50919 Malignant neoplasm of unspecified site of unspecified female breast: Secondary | ICD-10-CM | POA: Diagnosis not present

## 2019-04-25 DIAGNOSIS — C50911 Malignant neoplasm of unspecified site of right female breast: Secondary | ICD-10-CM | POA: Diagnosis not present

## 2019-04-25 DIAGNOSIS — Z170 Estrogen receptor positive status [ER+]: Secondary | ICD-10-CM | POA: Diagnosis not present

## 2019-04-25 DIAGNOSIS — C7951 Secondary malignant neoplasm of bone: Secondary | ICD-10-CM | POA: Diagnosis not present

## 2019-05-09 DIAGNOSIS — C50911 Malignant neoplasm of unspecified site of right female breast: Secondary | ICD-10-CM | POA: Diagnosis not present

## 2019-05-09 DIAGNOSIS — Z170 Estrogen receptor positive status [ER+]: Secondary | ICD-10-CM | POA: Diagnosis not present

## 2019-05-09 DIAGNOSIS — Z79899 Other long term (current) drug therapy: Secondary | ICD-10-CM | POA: Diagnosis not present

## 2019-05-09 DIAGNOSIS — Z79811 Long term (current) use of aromatase inhibitors: Secondary | ICD-10-CM | POA: Diagnosis not present

## 2019-05-09 DIAGNOSIS — C7951 Secondary malignant neoplasm of bone: Secondary | ICD-10-CM | POA: Diagnosis not present

## 2019-05-26 ENCOUNTER — Other Ambulatory Visit: Payer: Self-pay

## 2019-06-01 DIAGNOSIS — F4320 Adjustment disorder, unspecified: Secondary | ICD-10-CM | POA: Diagnosis not present

## 2019-06-01 DIAGNOSIS — G4700 Insomnia, unspecified: Secondary | ICD-10-CM | POA: Diagnosis not present

## 2019-06-01 DIAGNOSIS — K5901 Slow transit constipation: Secondary | ICD-10-CM | POA: Diagnosis not present

## 2019-06-01 DIAGNOSIS — R530 Neoplastic (malignant) related fatigue: Secondary | ICD-10-CM | POA: Diagnosis not present

## 2019-06-01 DIAGNOSIS — G893 Neoplasm related pain (acute) (chronic): Secondary | ICD-10-CM | POA: Diagnosis not present

## 2019-06-01 DIAGNOSIS — C50919 Malignant neoplasm of unspecified site of unspecified female breast: Secondary | ICD-10-CM | POA: Diagnosis not present

## 2019-06-06 DIAGNOSIS — C50911 Malignant neoplasm of unspecified site of right female breast: Secondary | ICD-10-CM | POA: Diagnosis not present

## 2019-06-06 DIAGNOSIS — Z79899 Other long term (current) drug therapy: Secondary | ICD-10-CM | POA: Diagnosis not present

## 2019-06-06 DIAGNOSIS — C7951 Secondary malignant neoplasm of bone: Secondary | ICD-10-CM | POA: Diagnosis not present

## 2019-06-06 DIAGNOSIS — Z170 Estrogen receptor positive status [ER+]: Secondary | ICD-10-CM | POA: Diagnosis not present

## 2019-06-06 DIAGNOSIS — Z79811 Long term (current) use of aromatase inhibitors: Secondary | ICD-10-CM | POA: Diagnosis not present

## 2019-07-05 DIAGNOSIS — C50912 Malignant neoplasm of unspecified site of left female breast: Secondary | ICD-10-CM | POA: Diagnosis not present

## 2019-07-05 DIAGNOSIS — C7951 Secondary malignant neoplasm of bone: Secondary | ICD-10-CM | POA: Diagnosis not present

## 2019-07-05 DIAGNOSIS — Z79811 Long term (current) use of aromatase inhibitors: Secondary | ICD-10-CM | POA: Diagnosis not present

## 2019-07-05 DIAGNOSIS — Z170 Estrogen receptor positive status [ER+]: Secondary | ICD-10-CM | POA: Diagnosis not present

## 2019-07-05 DIAGNOSIS — Z79899 Other long term (current) drug therapy: Secondary | ICD-10-CM | POA: Diagnosis not present

## 2019-07-05 DIAGNOSIS — Z5112 Encounter for antineoplastic immunotherapy: Secondary | ICD-10-CM | POA: Diagnosis not present

## 2019-07-13 DIAGNOSIS — Z23 Encounter for immunization: Secondary | ICD-10-CM | POA: Diagnosis not present

## 2019-07-15 DIAGNOSIS — G893 Neoplasm related pain (acute) (chronic): Secondary | ICD-10-CM | POA: Diagnosis not present

## 2019-07-15 DIAGNOSIS — C50911 Malignant neoplasm of unspecified site of right female breast: Secondary | ICD-10-CM | POA: Diagnosis not present

## 2019-07-22 DIAGNOSIS — C50919 Malignant neoplasm of unspecified site of unspecified female breast: Secondary | ICD-10-CM | POA: Diagnosis not present

## 2019-07-22 DIAGNOSIS — C7951 Secondary malignant neoplasm of bone: Secondary | ICD-10-CM | POA: Diagnosis not present

## 2019-07-22 DIAGNOSIS — Z170 Estrogen receptor positive status [ER+]: Secondary | ICD-10-CM | POA: Diagnosis not present

## 2019-07-22 DIAGNOSIS — Z79811 Long term (current) use of aromatase inhibitors: Secondary | ICD-10-CM | POA: Diagnosis not present

## 2019-07-22 DIAGNOSIS — Z79899 Other long term (current) drug therapy: Secondary | ICD-10-CM | POA: Diagnosis not present

## 2019-07-22 DIAGNOSIS — C50911 Malignant neoplasm of unspecified site of right female breast: Secondary | ICD-10-CM | POA: Diagnosis not present

## 2019-07-22 DIAGNOSIS — R590 Localized enlarged lymph nodes: Secondary | ICD-10-CM | POA: Diagnosis not present

## 2019-07-25 DIAGNOSIS — R740 Nonspecific elevation of levels of transaminase and lactic acid dehydrogenase [LDH]: Secondary | ICD-10-CM | POA: Diagnosis not present

## 2019-07-25 DIAGNOSIS — Z880 Allergy status to penicillin: Secondary | ICD-10-CM | POA: Diagnosis not present

## 2019-07-25 DIAGNOSIS — Z923 Personal history of irradiation: Secondary | ICD-10-CM | POA: Diagnosis not present

## 2019-07-25 DIAGNOSIS — K760 Fatty (change of) liver, not elsewhere classified: Secondary | ICD-10-CM | POA: Diagnosis not present

## 2019-07-25 DIAGNOSIS — Z87311 Personal history of (healed) other pathological fracture: Secondary | ICD-10-CM | POA: Diagnosis not present

## 2019-07-25 DIAGNOSIS — C7951 Secondary malignant neoplasm of bone: Secondary | ICD-10-CM | POA: Diagnosis not present

## 2019-07-25 DIAGNOSIS — M48061 Spinal stenosis, lumbar region without neurogenic claudication: Secondary | ICD-10-CM | POA: Diagnosis not present

## 2019-07-25 DIAGNOSIS — Z170 Estrogen receptor positive status [ER+]: Secondary | ICD-10-CM | POA: Diagnosis not present

## 2019-07-25 DIAGNOSIS — Z9889 Other specified postprocedural states: Secondary | ICD-10-CM | POA: Diagnosis not present

## 2019-07-25 DIAGNOSIS — C50211 Malignant neoplasm of upper-inner quadrant of right female breast: Secondary | ICD-10-CM | POA: Diagnosis not present

## 2019-07-25 DIAGNOSIS — Z800 Family history of malignant neoplasm of digestive organs: Secondary | ICD-10-CM | POA: Diagnosis not present

## 2019-07-25 DIAGNOSIS — R040 Epistaxis: Secondary | ICD-10-CM | POA: Diagnosis not present

## 2019-07-25 DIAGNOSIS — I700 Atherosclerosis of aorta: Secondary | ICD-10-CM | POA: Diagnosis not present

## 2019-07-25 DIAGNOSIS — Z8042 Family history of malignant neoplasm of prostate: Secondary | ICD-10-CM | POA: Diagnosis not present

## 2019-07-25 DIAGNOSIS — Z9049 Acquired absence of other specified parts of digestive tract: Secondary | ICD-10-CM | POA: Diagnosis not present

## 2019-07-25 DIAGNOSIS — Z79811 Long term (current) use of aromatase inhibitors: Secondary | ICD-10-CM | POA: Diagnosis not present

## 2019-07-25 DIAGNOSIS — E079 Disorder of thyroid, unspecified: Secondary | ICD-10-CM | POA: Diagnosis not present

## 2019-07-25 DIAGNOSIS — I2510 Atherosclerotic heart disease of native coronary artery without angina pectoris: Secondary | ICD-10-CM | POA: Diagnosis not present

## 2019-07-25 DIAGNOSIS — Z882 Allergy status to sulfonamides status: Secondary | ICD-10-CM | POA: Diagnosis not present

## 2019-08-01 ENCOUNTER — Ambulatory Visit: Payer: PRIVATE HEALTH INSURANCE

## 2019-08-01 DIAGNOSIS — Z853 Personal history of malignant neoplasm of breast: Secondary | ICD-10-CM | POA: Diagnosis not present

## 2019-08-01 DIAGNOSIS — C7951 Secondary malignant neoplasm of bone: Secondary | ICD-10-CM | POA: Diagnosis not present

## 2019-08-01 DIAGNOSIS — Z9011 Acquired absence of right breast and nipple: Secondary | ICD-10-CM | POA: Diagnosis not present

## 2019-08-01 DIAGNOSIS — Z79899 Other long term (current) drug therapy: Secondary | ICD-10-CM | POA: Diagnosis not present

## 2019-08-01 DIAGNOSIS — Z79811 Long term (current) use of aromatase inhibitors: Secondary | ICD-10-CM | POA: Diagnosis not present

## 2019-08-02 DIAGNOSIS — Z79811 Long term (current) use of aromatase inhibitors: Secondary | ICD-10-CM | POA: Diagnosis not present

## 2019-08-02 DIAGNOSIS — C50911 Malignant neoplasm of unspecified site of right female breast: Secondary | ICD-10-CM | POA: Diagnosis not present

## 2019-08-02 DIAGNOSIS — C7951 Secondary malignant neoplasm of bone: Secondary | ICD-10-CM | POA: Diagnosis not present

## 2019-08-02 DIAGNOSIS — Z5112 Encounter for antineoplastic immunotherapy: Secondary | ICD-10-CM | POA: Diagnosis not present

## 2019-08-02 DIAGNOSIS — Z79899 Other long term (current) drug therapy: Secondary | ICD-10-CM | POA: Diagnosis not present

## 2019-08-02 DIAGNOSIS — Z170 Estrogen receptor positive status [ER+]: Secondary | ICD-10-CM | POA: Diagnosis not present

## 2019-08-29 DIAGNOSIS — G4700 Insomnia, unspecified: Secondary | ICD-10-CM | POA: Diagnosis not present

## 2019-08-29 DIAGNOSIS — F4322 Adjustment disorder with anxiety: Secondary | ICD-10-CM | POA: Diagnosis not present

## 2019-08-29 DIAGNOSIS — G893 Neoplasm related pain (acute) (chronic): Secondary | ICD-10-CM | POA: Diagnosis not present

## 2019-08-29 DIAGNOSIS — Z170 Estrogen receptor positive status [ER+]: Secondary | ICD-10-CM | POA: Diagnosis not present

## 2019-08-29 DIAGNOSIS — K5901 Slow transit constipation: Secondary | ICD-10-CM | POA: Diagnosis not present

## 2019-08-29 DIAGNOSIS — C50919 Malignant neoplasm of unspecified site of unspecified female breast: Secondary | ICD-10-CM | POA: Diagnosis not present

## 2019-08-30 DIAGNOSIS — Z923 Personal history of irradiation: Secondary | ICD-10-CM | POA: Diagnosis not present

## 2019-08-30 DIAGNOSIS — G893 Neoplasm related pain (acute) (chronic): Secondary | ICD-10-CM | POA: Diagnosis not present

## 2019-08-30 DIAGNOSIS — F4322 Adjustment disorder with anxiety: Secondary | ICD-10-CM | POA: Diagnosis not present

## 2019-08-30 DIAGNOSIS — G4700 Insomnia, unspecified: Secondary | ICD-10-CM | POA: Diagnosis not present

## 2019-08-30 DIAGNOSIS — Z170 Estrogen receptor positive status [ER+]: Secondary | ICD-10-CM | POA: Diagnosis not present

## 2019-08-30 DIAGNOSIS — Z9889 Other specified postprocedural states: Secondary | ICD-10-CM | POA: Diagnosis not present

## 2019-08-30 DIAGNOSIS — Z5112 Encounter for antineoplastic immunotherapy: Secondary | ICD-10-CM | POA: Diagnosis not present

## 2019-08-30 DIAGNOSIS — C7951 Secondary malignant neoplasm of bone: Secondary | ICD-10-CM | POA: Diagnosis not present

## 2019-08-30 DIAGNOSIS — C50912 Malignant neoplasm of unspecified site of left female breast: Secondary | ICD-10-CM | POA: Diagnosis not present

## 2019-08-30 DIAGNOSIS — Z79811 Long term (current) use of aromatase inhibitors: Secondary | ICD-10-CM | POA: Diagnosis not present

## 2019-08-30 DIAGNOSIS — K5901 Slow transit constipation: Secondary | ICD-10-CM | POA: Diagnosis not present

## 2019-08-30 DIAGNOSIS — Z79899 Other long term (current) drug therapy: Secondary | ICD-10-CM | POA: Diagnosis not present

## 2019-09-02 DIAGNOSIS — L259 Unspecified contact dermatitis, unspecified cause: Secondary | ICD-10-CM | POA: Diagnosis not present

## 2019-09-05 DIAGNOSIS — C7951 Secondary malignant neoplasm of bone: Secondary | ICD-10-CM | POA: Diagnosis not present

## 2019-09-05 DIAGNOSIS — C50919 Malignant neoplasm of unspecified site of unspecified female breast: Secondary | ICD-10-CM | POA: Diagnosis not present

## 2019-09-05 DIAGNOSIS — L239 Allergic contact dermatitis, unspecified cause: Secondary | ICD-10-CM | POA: Diagnosis not present

## 2019-09-05 DIAGNOSIS — Z170 Estrogen receptor positive status [ER+]: Secondary | ICD-10-CM | POA: Diagnosis not present

## 2019-09-13 DIAGNOSIS — Z0000 Encounter for general adult medical examination without abnormal findings: Secondary | ICD-10-CM | POA: Diagnosis not present

## 2019-09-13 DIAGNOSIS — Z6833 Body mass index (BMI) 33.0-33.9, adult: Secondary | ICD-10-CM | POA: Diagnosis not present

## 2019-09-13 DIAGNOSIS — Z713 Dietary counseling and surveillance: Secondary | ICD-10-CM | POA: Diagnosis not present

## 2019-09-13 DIAGNOSIS — Z1321 Encounter for screening for nutritional disorder: Secondary | ICD-10-CM | POA: Diagnosis not present

## 2019-09-13 DIAGNOSIS — Z1331 Encounter for screening for depression: Secondary | ICD-10-CM | POA: Diagnosis not present

## 2019-09-13 DIAGNOSIS — Z13220 Encounter for screening for lipoid disorders: Secondary | ICD-10-CM | POA: Diagnosis not present

## 2019-09-13 DIAGNOSIS — Z131 Encounter for screening for diabetes mellitus: Secondary | ICD-10-CM | POA: Diagnosis not present

## 2019-09-16 DIAGNOSIS — L509 Urticaria, unspecified: Secondary | ICD-10-CM | POA: Diagnosis not present

## 2019-09-16 DIAGNOSIS — R21 Rash and other nonspecific skin eruption: Secondary | ICD-10-CM | POA: Diagnosis not present

## 2019-09-19 DIAGNOSIS — Z0000 Encounter for general adult medical examination without abnormal findings: Secondary | ICD-10-CM | POA: Diagnosis not present

## 2019-09-19 DIAGNOSIS — Z1321 Encounter for screening for nutritional disorder: Secondary | ICD-10-CM | POA: Diagnosis not present

## 2019-09-19 DIAGNOSIS — Z13220 Encounter for screening for lipoid disorders: Secondary | ICD-10-CM | POA: Diagnosis not present

## 2019-09-19 DIAGNOSIS — Z131 Encounter for screening for diabetes mellitus: Secondary | ICD-10-CM | POA: Diagnosis not present

## 2019-09-27 DIAGNOSIS — Z923 Personal history of irradiation: Secondary | ICD-10-CM | POA: Diagnosis not present

## 2019-09-27 DIAGNOSIS — Z170 Estrogen receptor positive status [ER+]: Secondary | ICD-10-CM | POA: Diagnosis not present

## 2019-09-27 DIAGNOSIS — Z79899 Other long term (current) drug therapy: Secondary | ICD-10-CM | POA: Diagnosis not present

## 2019-09-27 DIAGNOSIS — Z79811 Long term (current) use of aromatase inhibitors: Secondary | ICD-10-CM | POA: Diagnosis not present

## 2019-09-27 DIAGNOSIS — Z9221 Personal history of antineoplastic chemotherapy: Secondary | ICD-10-CM | POA: Diagnosis not present

## 2019-09-27 DIAGNOSIS — C7951 Secondary malignant neoplasm of bone: Secondary | ICD-10-CM | POA: Diagnosis not present

## 2019-09-27 DIAGNOSIS — Z5112 Encounter for antineoplastic immunotherapy: Secondary | ICD-10-CM | POA: Diagnosis not present

## 2019-09-27 DIAGNOSIS — C50911 Malignant neoplasm of unspecified site of right female breast: Secondary | ICD-10-CM | POA: Diagnosis not present

## 2019-09-27 DIAGNOSIS — C50919 Malignant neoplasm of unspecified site of unspecified female breast: Secondary | ICD-10-CM | POA: Diagnosis not present

## 2019-09-28 DIAGNOSIS — E0590 Thyrotoxicosis, unspecified without thyrotoxic crisis or storm: Secondary | ICD-10-CM | POA: Diagnosis not present

## 2019-09-28 DIAGNOSIS — F458 Other somatoform disorders: Secondary | ICD-10-CM | POA: Diagnosis not present

## 2019-09-28 DIAGNOSIS — R748 Abnormal levels of other serum enzymes: Secondary | ICD-10-CM | POA: Diagnosis not present

## 2019-09-28 DIAGNOSIS — Z712 Person consulting for explanation of examination or test findings: Secondary | ICD-10-CM | POA: Diagnosis not present

## 2019-09-28 DIAGNOSIS — E559 Vitamin D deficiency, unspecified: Secondary | ICD-10-CM | POA: Diagnosis not present

## 2019-09-28 DIAGNOSIS — R21 Rash and other nonspecific skin eruption: Secondary | ICD-10-CM | POA: Diagnosis not present

## 2019-09-28 DIAGNOSIS — Z20828 Contact with and (suspected) exposure to other viral communicable diseases: Secondary | ICD-10-CM | POA: Diagnosis not present

## 2019-09-28 DIAGNOSIS — L509 Urticaria, unspecified: Secondary | ICD-10-CM | POA: Diagnosis not present

## 2019-09-30 DIAGNOSIS — R221 Localized swelling, mass and lump, neck: Secondary | ICD-10-CM | POA: Diagnosis not present

## 2019-10-03 DIAGNOSIS — L501 Idiopathic urticaria: Secondary | ICD-10-CM | POA: Diagnosis not present

## 2019-10-03 DIAGNOSIS — L298 Other pruritus: Secondary | ICD-10-CM | POA: Diagnosis not present

## 2019-10-03 DIAGNOSIS — J301 Allergic rhinitis due to pollen: Secondary | ICD-10-CM | POA: Diagnosis not present

## 2019-10-03 DIAGNOSIS — T783XXA Angioneurotic edema, initial encounter: Secondary | ICD-10-CM | POA: Diagnosis not present

## 2019-10-25 DIAGNOSIS — Z79811 Long term (current) use of aromatase inhibitors: Secondary | ICD-10-CM | POA: Diagnosis not present

## 2019-10-25 DIAGNOSIS — K5901 Slow transit constipation: Secondary | ICD-10-CM | POA: Diagnosis not present

## 2019-10-25 DIAGNOSIS — E079 Disorder of thyroid, unspecified: Secondary | ICD-10-CM | POA: Diagnosis not present

## 2019-10-25 DIAGNOSIS — Z658 Other specified problems related to psychosocial circumstances: Secondary | ICD-10-CM | POA: Diagnosis not present

## 2019-10-25 DIAGNOSIS — C50211 Malignant neoplasm of upper-inner quadrant of right female breast: Secondary | ICD-10-CM | POA: Diagnosis not present

## 2019-10-25 DIAGNOSIS — Z882 Allergy status to sulfonamides status: Secondary | ICD-10-CM | POA: Diagnosis not present

## 2019-10-25 DIAGNOSIS — Z923 Personal history of irradiation: Secondary | ICD-10-CM | POA: Diagnosis not present

## 2019-10-25 DIAGNOSIS — R5383 Other fatigue: Secondary | ICD-10-CM | POA: Diagnosis not present

## 2019-10-25 DIAGNOSIS — C7951 Secondary malignant neoplasm of bone: Secondary | ICD-10-CM | POA: Diagnosis not present

## 2019-10-25 DIAGNOSIS — G893 Neoplasm related pain (acute) (chronic): Secondary | ICD-10-CM | POA: Diagnosis not present

## 2019-10-25 DIAGNOSIS — R530 Neoplastic (malignant) related fatigue: Secondary | ICD-10-CM | POA: Diagnosis not present

## 2019-10-25 DIAGNOSIS — R630 Anorexia: Secondary | ICD-10-CM | POA: Diagnosis not present

## 2019-10-25 DIAGNOSIS — Z880 Allergy status to penicillin: Secondary | ICD-10-CM | POA: Diagnosis not present

## 2019-10-25 DIAGNOSIS — R439 Unspecified disturbances of smell and taste: Secondary | ICD-10-CM | POA: Diagnosis not present

## 2019-10-25 DIAGNOSIS — R110 Nausea: Secondary | ICD-10-CM | POA: Diagnosis not present

## 2019-10-25 DIAGNOSIS — Z170 Estrogen receptor positive status [ER+]: Secondary | ICD-10-CM | POA: Diagnosis not present

## 2019-11-18 DIAGNOSIS — C50912 Malignant neoplasm of unspecified site of left female breast: Secondary | ICD-10-CM | POA: Diagnosis not present

## 2019-11-18 DIAGNOSIS — Z170 Estrogen receptor positive status [ER+]: Secondary | ICD-10-CM | POA: Diagnosis not present

## 2019-11-18 DIAGNOSIS — C7951 Secondary malignant neoplasm of bone: Secondary | ICD-10-CM | POA: Diagnosis not present

## 2019-11-20 DIAGNOSIS — Z23 Encounter for immunization: Secondary | ICD-10-CM | POA: Diagnosis not present

## 2019-11-22 DIAGNOSIS — Z79811 Long term (current) use of aromatase inhibitors: Secondary | ICD-10-CM | POA: Diagnosis not present

## 2019-11-22 DIAGNOSIS — Z170 Estrogen receptor positive status [ER+]: Secondary | ICD-10-CM | POA: Diagnosis not present

## 2019-11-22 DIAGNOSIS — C7951 Secondary malignant neoplasm of bone: Secondary | ICD-10-CM | POA: Diagnosis not present

## 2019-11-22 DIAGNOSIS — Z79899 Other long term (current) drug therapy: Secondary | ICD-10-CM | POA: Diagnosis not present

## 2019-11-22 DIAGNOSIS — Z5112 Encounter for antineoplastic immunotherapy: Secondary | ICD-10-CM | POA: Diagnosis not present

## 2019-11-22 DIAGNOSIS — C50912 Malignant neoplasm of unspecified site of left female breast: Secondary | ICD-10-CM | POA: Diagnosis not present

## 2019-11-30 DIAGNOSIS — C50912 Malignant neoplasm of unspecified site of left female breast: Secondary | ICD-10-CM | POA: Diagnosis not present

## 2019-11-30 DIAGNOSIS — Z8781 Personal history of (healed) traumatic fracture: Secondary | ICD-10-CM | POA: Diagnosis not present

## 2019-11-30 DIAGNOSIS — Z79811 Long term (current) use of aromatase inhibitors: Secondary | ICD-10-CM | POA: Diagnosis not present

## 2019-11-30 DIAGNOSIS — Z170 Estrogen receptor positive status [ER+]: Secondary | ICD-10-CM | POA: Diagnosis not present

## 2019-11-30 DIAGNOSIS — Z923 Personal history of irradiation: Secondary | ICD-10-CM | POA: Diagnosis not present

## 2019-11-30 DIAGNOSIS — Z882 Allergy status to sulfonamides status: Secondary | ICD-10-CM | POA: Diagnosis not present

## 2019-11-30 DIAGNOSIS — C50211 Malignant neoplasm of upper-inner quadrant of right female breast: Secondary | ICD-10-CM | POA: Diagnosis not present

## 2019-11-30 DIAGNOSIS — E041 Nontoxic single thyroid nodule: Secondary | ICD-10-CM | POA: Diagnosis not present

## 2019-11-30 DIAGNOSIS — E042 Nontoxic multinodular goiter: Secondary | ICD-10-CM | POA: Diagnosis not present

## 2019-11-30 DIAGNOSIS — Z87311 Personal history of (healed) other pathological fracture: Secondary | ICD-10-CM | POA: Diagnosis not present

## 2019-11-30 DIAGNOSIS — Z87891 Personal history of nicotine dependence: Secondary | ICD-10-CM | POA: Diagnosis not present

## 2019-11-30 DIAGNOSIS — Z9221 Personal history of antineoplastic chemotherapy: Secondary | ICD-10-CM | POA: Diagnosis not present

## 2019-11-30 DIAGNOSIS — Z800 Family history of malignant neoplasm of digestive organs: Secondary | ICD-10-CM | POA: Diagnosis not present

## 2019-11-30 DIAGNOSIS — R9389 Abnormal findings on diagnostic imaging of other specified body structures: Secondary | ICD-10-CM | POA: Diagnosis not present

## 2019-11-30 DIAGNOSIS — C50919 Malignant neoplasm of unspecified site of unspecified female breast: Secondary | ICD-10-CM | POA: Diagnosis not present

## 2019-11-30 DIAGNOSIS — Z8042 Family history of malignant neoplasm of prostate: Secondary | ICD-10-CM | POA: Diagnosis not present

## 2019-11-30 DIAGNOSIS — C7951 Secondary malignant neoplasm of bone: Secondary | ICD-10-CM | POA: Diagnosis not present

## 2019-11-30 DIAGNOSIS — Z79899 Other long term (current) drug therapy: Secondary | ICD-10-CM | POA: Diagnosis not present

## 2019-11-30 DIAGNOSIS — Z9049 Acquired absence of other specified parts of digestive tract: Secondary | ICD-10-CM | POA: Diagnosis not present

## 2019-11-30 DIAGNOSIS — Z880 Allergy status to penicillin: Secondary | ICD-10-CM | POA: Diagnosis not present

## 2019-11-30 DIAGNOSIS — Z9889 Other specified postprocedural states: Secondary | ICD-10-CM | POA: Diagnosis not present

## 2019-11-30 DIAGNOSIS — E278 Other specified disorders of adrenal gland: Secondary | ICD-10-CM | POA: Diagnosis not present

## 2019-12-01 DIAGNOSIS — E041 Nontoxic single thyroid nodule: Secondary | ICD-10-CM | POA: Diagnosis not present

## 2019-12-08 ENCOUNTER — Telehealth: Payer: Self-pay

## 2019-12-08 NOTE — Telephone Encounter (Signed)
LMTCB to inquire about telephonic AWV. Last AWV was 07/29/18.

## 2019-12-11 DIAGNOSIS — Z23 Encounter for immunization: Secondary | ICD-10-CM | POA: Diagnosis not present

## 2019-12-15 NOTE — Telephone Encounter (Signed)
Pt has moved to [place] ans is no longer a pt at [facility]. Removed from list.

## 2019-12-19 DIAGNOSIS — C50912 Malignant neoplasm of unspecified site of left female breast: Secondary | ICD-10-CM | POA: Diagnosis not present

## 2019-12-19 DIAGNOSIS — Z170 Estrogen receptor positive status [ER+]: Secondary | ICD-10-CM | POA: Diagnosis not present

## 2019-12-19 DIAGNOSIS — C7951 Secondary malignant neoplasm of bone: Secondary | ICD-10-CM | POA: Diagnosis not present

## 2019-12-20 DIAGNOSIS — Z79811 Long term (current) use of aromatase inhibitors: Secondary | ICD-10-CM | POA: Diagnosis not present

## 2019-12-20 DIAGNOSIS — E079 Disorder of thyroid, unspecified: Secondary | ICD-10-CM | POA: Diagnosis not present

## 2019-12-20 DIAGNOSIS — Z882 Allergy status to sulfonamides status: Secondary | ICD-10-CM | POA: Diagnosis not present

## 2019-12-20 DIAGNOSIS — Z9049 Acquired absence of other specified parts of digestive tract: Secondary | ICD-10-CM | POA: Diagnosis not present

## 2019-12-20 DIAGNOSIS — L506 Contact urticaria: Secondary | ICD-10-CM | POA: Diagnosis not present

## 2019-12-20 DIAGNOSIS — Z87311 Personal history of (healed) other pathological fracture: Secondary | ICD-10-CM | POA: Diagnosis not present

## 2019-12-20 DIAGNOSIS — Z79899 Other long term (current) drug therapy: Secondary | ICD-10-CM | POA: Diagnosis not present

## 2019-12-20 DIAGNOSIS — Z87891 Personal history of nicotine dependence: Secondary | ICD-10-CM | POA: Diagnosis not present

## 2019-12-20 DIAGNOSIS — Z880 Allergy status to penicillin: Secondary | ICD-10-CM | POA: Diagnosis not present

## 2019-12-20 DIAGNOSIS — Z9889 Other specified postprocedural states: Secondary | ICD-10-CM | POA: Diagnosis not present

## 2019-12-20 DIAGNOSIS — Z923 Personal history of irradiation: Secondary | ICD-10-CM | POA: Diagnosis not present

## 2019-12-20 DIAGNOSIS — C7951 Secondary malignant neoplasm of bone: Secondary | ICD-10-CM | POA: Diagnosis not present

## 2019-12-20 DIAGNOSIS — C50911 Malignant neoplasm of unspecified site of right female breast: Secondary | ICD-10-CM | POA: Diagnosis not present

## 2019-12-20 DIAGNOSIS — C50912 Malignant neoplasm of unspecified site of left female breast: Secondary | ICD-10-CM | POA: Diagnosis not present

## 2019-12-20 DIAGNOSIS — C50919 Malignant neoplasm of unspecified site of unspecified female breast: Secondary | ICD-10-CM | POA: Diagnosis not present

## 2019-12-20 DIAGNOSIS — Z170 Estrogen receptor positive status [ER+]: Secondary | ICD-10-CM | POA: Diagnosis not present

## 2019-12-20 DIAGNOSIS — L508 Other urticaria: Secondary | ICD-10-CM | POA: Diagnosis not present

## 2019-12-20 DIAGNOSIS — G893 Neoplasm related pain (acute) (chronic): Secondary | ICD-10-CM | POA: Diagnosis not present

## 2019-12-20 DIAGNOSIS — I700 Atherosclerosis of aorta: Secondary | ICD-10-CM | POA: Diagnosis not present

## 2019-12-20 DIAGNOSIS — K760 Fatty (change of) liver, not elsewhere classified: Secondary | ICD-10-CM | POA: Diagnosis not present

## 2019-12-20 DIAGNOSIS — I2510 Atherosclerotic heart disease of native coronary artery without angina pectoris: Secondary | ICD-10-CM | POA: Diagnosis not present

## 2019-12-20 DIAGNOSIS — Z5112 Encounter for antineoplastic immunotherapy: Secondary | ICD-10-CM | POA: Diagnosis not present

## 2019-12-27 DIAGNOSIS — Z9049 Acquired absence of other specified parts of digestive tract: Secondary | ICD-10-CM | POA: Diagnosis not present

## 2019-12-27 DIAGNOSIS — Z880 Allergy status to penicillin: Secondary | ICD-10-CM | POA: Diagnosis not present

## 2019-12-27 DIAGNOSIS — G893 Neoplasm related pain (acute) (chronic): Secondary | ICD-10-CM | POA: Diagnosis not present

## 2019-12-27 DIAGNOSIS — Z800 Family history of malignant neoplasm of digestive organs: Secondary | ICD-10-CM | POA: Diagnosis not present

## 2019-12-27 DIAGNOSIS — Z882 Allergy status to sulfonamides status: Secondary | ICD-10-CM | POA: Diagnosis not present

## 2019-12-27 DIAGNOSIS — C50919 Malignant neoplasm of unspecified site of unspecified female breast: Secondary | ICD-10-CM | POA: Diagnosis not present

## 2019-12-27 DIAGNOSIS — R530 Neoplastic (malignant) related fatigue: Secondary | ICD-10-CM | POA: Diagnosis not present

## 2019-12-27 DIAGNOSIS — Z853 Personal history of malignant neoplasm of breast: Secondary | ICD-10-CM | POA: Diagnosis not present

## 2019-12-27 DIAGNOSIS — Z170 Estrogen receptor positive status [ER+]: Secondary | ICD-10-CM | POA: Diagnosis not present

## 2019-12-27 DIAGNOSIS — Z923 Personal history of irradiation: Secondary | ICD-10-CM | POA: Diagnosis not present

## 2019-12-27 DIAGNOSIS — Z8042 Family history of malignant neoplasm of prostate: Secondary | ICD-10-CM | POA: Diagnosis not present

## 2019-12-27 DIAGNOSIS — Z79811 Long term (current) use of aromatase inhibitors: Secondary | ICD-10-CM | POA: Diagnosis not present

## 2019-12-27 DIAGNOSIS — L509 Urticaria, unspecified: Secondary | ICD-10-CM | POA: Diagnosis not present

## 2019-12-27 DIAGNOSIS — Z87891 Personal history of nicotine dependence: Secondary | ICD-10-CM | POA: Diagnosis not present

## 2019-12-27 DIAGNOSIS — K5901 Slow transit constipation: Secondary | ICD-10-CM | POA: Diagnosis not present

## 2019-12-27 DIAGNOSIS — C7951 Secondary malignant neoplasm of bone: Secondary | ICD-10-CM | POA: Diagnosis not present

## 2019-12-27 DIAGNOSIS — Z79899 Other long term (current) drug therapy: Secondary | ICD-10-CM | POA: Diagnosis not present

## 2020-01-15 DIAGNOSIS — S32048A Other fracture of fourth lumbar vertebra, initial encounter for closed fracture: Secondary | ICD-10-CM | POA: Diagnosis not present

## 2020-01-15 DIAGNOSIS — C50919 Malignant neoplasm of unspecified site of unspecified female breast: Secondary | ICD-10-CM | POA: Diagnosis not present

## 2020-01-15 DIAGNOSIS — I898 Other specified noninfective disorders of lymphatic vessels and lymph nodes: Secondary | ICD-10-CM | POA: Diagnosis not present

## 2020-01-15 DIAGNOSIS — Z170 Estrogen receptor positive status [ER+]: Secondary | ICD-10-CM | POA: Diagnosis not present

## 2020-01-15 DIAGNOSIS — C7951 Secondary malignant neoplasm of bone: Secondary | ICD-10-CM | POA: Diagnosis not present

## 2020-01-16 DIAGNOSIS — Z170 Estrogen receptor positive status [ER+]: Secondary | ICD-10-CM | POA: Diagnosis not present

## 2020-01-16 DIAGNOSIS — C50912 Malignant neoplasm of unspecified site of left female breast: Secondary | ICD-10-CM | POA: Diagnosis not present

## 2020-01-16 DIAGNOSIS — C7951 Secondary malignant neoplasm of bone: Secondary | ICD-10-CM | POA: Diagnosis not present

## 2020-01-16 DIAGNOSIS — Z79899 Other long term (current) drug therapy: Secondary | ICD-10-CM | POA: Diagnosis not present

## 2020-01-16 DIAGNOSIS — Z923 Personal history of irradiation: Secondary | ICD-10-CM | POA: Diagnosis not present

## 2020-01-16 DIAGNOSIS — S32048A Other fracture of fourth lumbar vertebra, initial encounter for closed fracture: Secondary | ICD-10-CM | POA: Diagnosis not present

## 2020-01-16 DIAGNOSIS — Z7952 Long term (current) use of systemic steroids: Secondary | ICD-10-CM | POA: Diagnosis not present

## 2020-01-16 DIAGNOSIS — Z79811 Long term (current) use of aromatase inhibitors: Secondary | ICD-10-CM | POA: Diagnosis not present

## 2020-01-16 DIAGNOSIS — C50919 Malignant neoplasm of unspecified site of unspecified female breast: Secondary | ICD-10-CM | POA: Diagnosis not present

## 2020-01-16 DIAGNOSIS — I898 Other specified noninfective disorders of lymphatic vessels and lymph nodes: Secondary | ICD-10-CM | POA: Diagnosis not present

## 2020-01-17 DIAGNOSIS — Z5112 Encounter for antineoplastic immunotherapy: Secondary | ICD-10-CM | POA: Diagnosis not present

## 2020-01-17 DIAGNOSIS — Z79899 Other long term (current) drug therapy: Secondary | ICD-10-CM | POA: Diagnosis not present

## 2020-01-17 DIAGNOSIS — Z880 Allergy status to penicillin: Secondary | ICD-10-CM | POA: Diagnosis not present

## 2020-01-17 DIAGNOSIS — C7951 Secondary malignant neoplasm of bone: Secondary | ICD-10-CM | POA: Diagnosis not present

## 2020-01-17 DIAGNOSIS — Z882 Allergy status to sulfonamides status: Secondary | ICD-10-CM | POA: Diagnosis not present

## 2020-01-17 DIAGNOSIS — Z170 Estrogen receptor positive status [ER+]: Secondary | ICD-10-CM | POA: Diagnosis not present

## 2020-01-17 DIAGNOSIS — Z79811 Long term (current) use of aromatase inhibitors: Secondary | ICD-10-CM | POA: Diagnosis not present

## 2020-01-17 DIAGNOSIS — Z923 Personal history of irradiation: Secondary | ICD-10-CM | POA: Diagnosis not present

## 2020-01-17 DIAGNOSIS — Z87891 Personal history of nicotine dependence: Secondary | ICD-10-CM | POA: Diagnosis not present

## 2020-01-17 DIAGNOSIS — C50912 Malignant neoplasm of unspecified site of left female breast: Secondary | ICD-10-CM | POA: Diagnosis not present

## 2020-01-19 DIAGNOSIS — M545 Low back pain: Secondary | ICD-10-CM | POA: Diagnosis not present

## 2020-01-19 DIAGNOSIS — L501 Idiopathic urticaria: Secondary | ICD-10-CM | POA: Diagnosis not present

## 2020-01-19 DIAGNOSIS — C7951 Secondary malignant neoplasm of bone: Secondary | ICD-10-CM | POA: Diagnosis not present

## 2020-01-19 DIAGNOSIS — L506 Contact urticaria: Secondary | ICD-10-CM | POA: Diagnosis not present

## 2020-01-19 DIAGNOSIS — C50912 Malignant neoplasm of unspecified site of left female breast: Secondary | ICD-10-CM | POA: Diagnosis not present

## 2020-01-19 DIAGNOSIS — C50911 Malignant neoplasm of unspecified site of right female breast: Secondary | ICD-10-CM | POA: Diagnosis not present

## 2020-01-19 DIAGNOSIS — Z170 Estrogen receptor positive status [ER+]: Secondary | ICD-10-CM | POA: Diagnosis not present
